# Patient Record
Sex: Female | Born: 1975
Health system: Southern US, Community
[De-identification: ages and names within clinical notes are randomized; demographics above are authoritative.]

## PROBLEM LIST (undated history)

## (undated) ENCOUNTER — Emergency Department (HOSPITAL_COMMUNITY): Discharge status: Absent without leave | Payer: Self-pay | Source: Home / Self Care

## (undated) DIAGNOSIS — IMO0001 Reserved for inherently not codable concepts without codable children: Secondary | ICD-10-CM

## (undated) DIAGNOSIS — J45909 Unspecified asthma, uncomplicated: Secondary | ICD-10-CM

## (undated) DIAGNOSIS — F419 Anxiety disorder, unspecified: Secondary | ICD-10-CM

## (undated) DIAGNOSIS — Z9889 Other specified postprocedural states: Secondary | ICD-10-CM

## (undated) DIAGNOSIS — B001 Herpesviral vesicular dermatitis: Secondary | ICD-10-CM

## (undated) HISTORY — DX: Unspecified asthma, uncomplicated: J45.909

## (undated) HISTORY — PX: INTRAUTERINE DEVICE INSERTION: SHX323

## (undated) HISTORY — DX: Herpesviral vesicular dermatitis: B00.1

## (undated) HISTORY — DX: Reserved for inherently not codable concepts without codable children: IMO0001

## (undated) HISTORY — DX: Other specified postprocedural states: Z98.890

## (undated) HISTORY — DX: Anxiety disorder, unspecified: F41.9

---

## 1994-02-23 HISTORY — PX: BREAST EXCISIONAL BIOPSY: SUR124

## 1994-02-23 HISTORY — PX: OTHER SURGICAL HISTORY: SHX169

## 1999-12-16 ENCOUNTER — Encounter: Admission: RE | Admit: 1999-12-16 | Discharge: 1999-12-16 | Payer: Self-pay | Admitting: Hematology and Oncology

## 2000-02-13 ENCOUNTER — Encounter: Admission: RE | Admit: 2000-02-13 | Discharge: 2000-02-13 | Payer: Self-pay | Admitting: Internal Medicine

## 2000-04-20 ENCOUNTER — Encounter: Admission: RE | Admit: 2000-04-20 | Discharge: 2000-04-20 | Payer: Self-pay | Admitting: General Surgery

## 2000-04-20 ENCOUNTER — Encounter (HOSPITAL_BASED_OUTPATIENT_CLINIC_OR_DEPARTMENT_OTHER): Payer: Self-pay | Admitting: General Surgery

## 2000-05-10 ENCOUNTER — Ambulatory Visit (HOSPITAL_COMMUNITY): Admission: RE | Admit: 2000-05-10 | Discharge: 2000-05-10 | Payer: Self-pay | Admitting: *Deleted

## 2000-07-15 ENCOUNTER — Inpatient Hospital Stay (HOSPITAL_COMMUNITY): Admission: AD | Admit: 2000-07-15 | Discharge: 2000-07-15 | Payer: Self-pay | Admitting: *Deleted

## 2000-10-05 ENCOUNTER — Inpatient Hospital Stay (HOSPITAL_COMMUNITY): Admission: AD | Admit: 2000-10-05 | Discharge: 2000-10-08 | Payer: Self-pay | Admitting: *Deleted

## 2000-10-13 ENCOUNTER — Encounter: Admission: RE | Admit: 2000-10-13 | Discharge: 2000-11-12 | Payer: Self-pay | Admitting: *Deleted

## 2002-01-06 ENCOUNTER — Other Ambulatory Visit: Admission: RE | Admit: 2002-01-06 | Discharge: 2002-01-06 | Payer: Self-pay | Admitting: Obstetrics and Gynecology

## 2002-08-04 ENCOUNTER — Encounter: Payer: Self-pay | Admitting: Family Medicine

## 2002-08-04 ENCOUNTER — Ambulatory Visit (HOSPITAL_COMMUNITY): Admission: RE | Admit: 2002-08-04 | Discharge: 2002-08-04 | Payer: Self-pay | Admitting: Family Medicine

## 2002-10-27 ENCOUNTER — Emergency Department (HOSPITAL_COMMUNITY): Admission: EM | Admit: 2002-10-27 | Discharge: 2002-10-27 | Payer: Self-pay | Admitting: *Deleted

## 2003-01-17 ENCOUNTER — Other Ambulatory Visit: Admission: RE | Admit: 2003-01-17 | Discharge: 2003-01-17 | Payer: Self-pay | Admitting: Obstetrics and Gynecology

## 2004-04-04 ENCOUNTER — Emergency Department (HOSPITAL_COMMUNITY): Admission: EM | Admit: 2004-04-04 | Discharge: 2004-04-04 | Payer: Self-pay | Admitting: Emergency Medicine

## 2004-04-05 ENCOUNTER — Emergency Department (HOSPITAL_COMMUNITY): Admission: EM | Admit: 2004-04-05 | Discharge: 2004-04-05 | Payer: Self-pay | Admitting: Emergency Medicine

## 2004-04-07 ENCOUNTER — Ambulatory Visit: Payer: Self-pay | Admitting: Pulmonary Disease

## 2004-04-22 ENCOUNTER — Ambulatory Visit: Payer: Self-pay | Admitting: Pulmonary Disease

## 2004-04-28 ENCOUNTER — Ambulatory Visit: Payer: Self-pay | Admitting: Pulmonary Disease

## 2004-09-03 ENCOUNTER — Other Ambulatory Visit: Admission: RE | Admit: 2004-09-03 | Discharge: 2004-09-03 | Payer: Self-pay | Admitting: Obstetrics and Gynecology

## 2005-02-23 LAB — HM HIV SCREENING LAB: HM HIV Screening: NEGATIVE

## 2005-02-23 LAB — HM HEPATITIS C SCREENING LAB: HM Hepatitis Screen: NEGATIVE

## 2005-04-08 ENCOUNTER — Inpatient Hospital Stay (HOSPITAL_COMMUNITY): Admission: AD | Admit: 2005-04-08 | Discharge: 2005-04-08 | Payer: Self-pay | Admitting: Obstetrics and Gynecology

## 2005-06-01 ENCOUNTER — Inpatient Hospital Stay (HOSPITAL_COMMUNITY): Admission: AD | Admit: 2005-06-01 | Discharge: 2005-06-03 | Payer: Self-pay | Admitting: Gynecology

## 2005-07-05 ENCOUNTER — Inpatient Hospital Stay (HOSPITAL_COMMUNITY): Admission: AD | Admit: 2005-07-05 | Discharge: 2005-07-07 | Payer: Self-pay | Admitting: Gynecology

## 2005-08-17 ENCOUNTER — Other Ambulatory Visit: Admission: RE | Admit: 2005-08-17 | Discharge: 2005-08-17 | Payer: Self-pay | Admitting: Gynecology

## 2005-08-23 DIAGNOSIS — IMO0001 Reserved for inherently not codable concepts without codable children: Secondary | ICD-10-CM

## 2005-08-23 HISTORY — DX: Reserved for inherently not codable concepts without codable children: IMO0001

## 2005-09-08 ENCOUNTER — Ambulatory Visit (HOSPITAL_COMMUNITY): Admission: RE | Admit: 2005-09-08 | Discharge: 2005-09-08 | Payer: Self-pay | Admitting: Infectious Diseases

## 2008-02-06 ENCOUNTER — Other Ambulatory Visit: Admission: RE | Admit: 2008-02-06 | Discharge: 2008-02-06 | Payer: Self-pay | Admitting: Family Medicine

## 2009-09-09 ENCOUNTER — Other Ambulatory Visit: Admission: RE | Admit: 2009-09-09 | Discharge: 2009-09-09 | Payer: Self-pay | Admitting: *Deleted

## 2009-09-09 ENCOUNTER — Encounter: Admission: RE | Admit: 2009-09-09 | Discharge: 2009-09-09 | Payer: Self-pay | Admitting: *Deleted

## 2009-09-16 ENCOUNTER — Ambulatory Visit (HOSPITAL_COMMUNITY): Admission: RE | Admit: 2009-09-16 | Discharge: 2009-09-16 | Payer: Self-pay | Admitting: *Deleted

## 2010-03-16 ENCOUNTER — Encounter: Payer: Self-pay | Admitting: *Deleted

## 2010-07-11 NOTE — Discharge Summary (Signed)
[name], [name]            ACCOUNT NO.:  [id number]   MEDICAL RECORD NO.:  [id number]          PATIENT TYPE:  INP   LOCATION:  9158                          FACILITY:  [facility]   PHYSICIAN:  Ivor Costa. Farrel Gobble, M.D. DATE OF BIRTH:  1975-04-28   DATE OF ADMISSION:  06/02/2005  DATE OF DISCHARGE:  06/03/2005                                 DISCHARGE SUMMARY   PRINCIPAL DIAGNOSIS:  Pyelonephritis at 36 weeks.   PRINCIPAL PROCEDURE:  IV antibiotics and a renal ultrasound.   HOSPITAL COURSE:  The patient was admitted on the evening of June 01, 2005  with complaints of low back pain and shaking chills.  The patient reportedly  had a temperature of 102 axillary; however, it defervesced rather quickly  with antipyretics.  She was started on IV antibiotics.  Of note, the patient  had recently been diagnosed with a urinary tract infection and had been  started on Macrobid.  The patient had received IV antibiotics until the  morning of June 03, 2005.  She remained afebrile since her admission.  She  had nonstress tests which were all reactive and assuring.  Her renal  ultrasound showed her kidneys to be essentially unremarkable with mild right  hydronephrosis, most likely related to her gravid uterus, but no evidence of  stones.  Her discharge condition was stable.   LABORATORY DATA:  Her white count was 12.5 with a hemoglobin of 10.4,  hematocrit of 30.3 and platelets of 309.  Her urinalysis showed small  hemoglobin and small leukocyte esterase.  The renal culture is still pending  at the time of discharge.   DISCHARGE MEDICATIONS:  The patient was discharged home with a prescription  for Augmentin 500 b.i.d., #14.   FOLLOW UP:  She was instructed to follow up in our office next week.      Ivor Costa. Farrel Gobble, M.D.  Electronically Signed     THL/MEDQ  D:  06/03/2005  T:  06/03/2005  Job:  [id number]

## 2010-07-11 NOTE — H&P (Signed)
NAMEMARLA, [name]            ACCOUNT NO.:  [id number]   MEDICAL RECORD NO.:  [id number]          PATIENT TYPE:  INP   LOCATION:  9158                          [facility]ACILITY:  [facility]   PHYSICIAN:  Timothy P. Fontaine, M.D.DATE OF BIRTH:  [birth year]   DATE OF ADMISSION:  06/01/2005  DATE OF DISCHARGE:                                HISTORY & PHYSICAL   CHIEF COMPLAINT:  Pregnancy at 37 weeks, right-sided low back pain, shaking  chills.   HISTORY OF PRESENT ILLNESS:  The patient is a 35 year old G2, P70 female at  [redacted] weeks gestation.  Uncomplicated pregnancy with the exception of one  episode of preterm contractions terminated with terbutaline.  No subsequent  therapy required.  She presents with a two-day history of right-sided low  back pain, frequency, and urgency.  She apparently had a urinalysis checked  at work which was suspicious for UTI.  She was begun on Macrodantin the day  of admission.  She subsequently developed shaking chills, increase in the  right low back pain, and presented for evaluation.  For the remainder of her  history, see her [facility].   PHYSICAL EXAMINATION:  VITAL SIGNS:  Axillary temperature 102.  Vital signs  are stable.  HEENT:  Normal.  LUNGS:  Clear.  CARDIAC:  Regular rate.  No rubs, murmurs, or gallops.  ABDOMEN:  Gravid, consistent with dates.  External monitor with reactive  fetal tracing, without regular contractions.  No decelerations.  BACK:  Spine is straight with mild right CVA tenderness.  EXTREMITIES:  Without evidence of DVT.   LABORATORY DATA:  Urinalysis shows 11-20 WBCs, 0-2 RBCs, rare epithelial  cells, small leukocyte esterase, small hemoglobin.  CBC:  White count 12.5,  hemoglobin 10.4, platelet count 309,000.   ASSESSMENT AND PLAN:  The patient is a 35 year old gravida 2, para 1 female  at [redacted] weeks gestation with early pyelonephritis.  She was begun on  intravenous antibiotics and intravenous hydration.  Will check maternal  renal  ultrasound and rule out hydronephrosis or gross evidence of renal  lithiasis.  The patient historically does not have a history of recurrent  urinary tract infections or renal lithiasis and otherwise has had an  uncomplicated pregnancy.  She does have a history of asthma for which she  uses occasional p.r.n. inhaler and has not had an issue with this recently.  The patient has received one dose of Unasyn, 3 gram bolus, with a subsequent  1.5 grams every six hours.  Her temperature curve has responded.  She  currently is afebrile with a temperature of 99.  Will continue on  intravenous antibiotics over the next 24 hours.  Assuming she does well,  ultrasound normal, will tentatively plan on discharge 24 hours after  admission to continue a home dose of oral antibiotics.      Timothy P. Fontaine, M.D.  Electronically Signed     TPF/MEDQ  D:  06/02/2005  T:  06/02/2005  Job:  [id number]

## 2010-07-11 NOTE — H&P (Signed)
[name], HORACE            ACCOUNT NO.:  [id number]   MEDICAL RECORD NO.:  [id number]           PATIENT TYPE:   LOCATION:                                 FACILITY:   PHYSICIAN:  Ivor Costa. Farrel Gobble, M.D.      DATE OF BIRTH:   DATE OF ADMISSION:  DATE OF DISCHARGE:                                HISTORY & PHYSICAL   CHIEF COMPLAINT:  Post dates pregnancy.   HISTORY OF PRESENT ILLNESS:  The patient is a 35 year old G2, P71 with  estimated date of confinement of Jul 01, 2005, estimated gestational age of  40-1/2 weeks, who has been seen in the office with multiple complaints of  prodromal labor.  The patient states that she has been contracting for  approximately one week, rather uncomfortable; however, the contractions  either remain far apart or stall out completely.  She never seems to get  into an active pattern.  She is not sleeping well and is rather  uncomfortable.  Her pregnancy was complicated by pyelonephritis.  The  patient also reports some mid back pain.  A urinalysis was nonspecific and a  culture was pending at the time of this dictation.  She was prophylactically  treated, however, with Macrobid b.i.d. #14.  Her pregnancy is also  complicated by a history of positive group B Strep.  She is A positive,  antibody negative, RPR nonreactive, rubella immune, hepatitis B surface  antigen nonreactive, HIV nonreactive, GBS positive.  Refer to the  [facility].   PHYSICAL EXAMINATION:  GENERAL APPEARANCE:  She is an uncomfortable female  in no acute distress.  LUNGS:  Clear to auscultation.  CARDIOVASCULAR:  Regular rate.  ABDOMEN:  38, heart tones auscultated.  VAGINAL:  Fingertip to 1, 50%, -2 but posterior.  EXTREMITIES:  Negative.   NST was reactive without contractions.   ASSESSMENT:  Prodromal labor without cervical change.  The patient is now  post dates.  We will go ahead and induce her with Cervidil on the evening of  Jul 05, 2005, followed by high dose  Pitocin in the morning of Jul 06, 2005.  The patient was in agreeable to the plan.      Ivor Costa. Farrel Gobble, M.D.  Electronically Signed     THL/MEDQ  D:  07/01/2005  T:  07/01/2005  Job:  [id number]

## 2010-07-17 ENCOUNTER — Ambulatory Visit: Payer: Self-pay | Admitting: Gynecology

## 2010-11-03 ENCOUNTER — Telehealth: Payer: Self-pay | Admitting: *Deleted

## 2010-11-03 ENCOUNTER — Other Ambulatory Visit: Payer: Self-pay | Admitting: *Deleted

## 2010-11-03 DIAGNOSIS — Z3049 Encounter for surveillance of other contraceptives: Secondary | ICD-10-CM

## 2010-11-03 MED ORDER — LEVONORGESTREL 20 MCG/24HR IU IUD: INTRAUTERINE_SYSTEM | Freq: Once | INTRAUTERINE | Status: AC

## 2010-11-03 NOTE — Telephone Encounter (Signed)
Patient informed Mirena cost would be $30 copay. Will call back after AEX to schedule.

## 2010-11-20 ENCOUNTER — Encounter: Payer: Self-pay | Admitting: Gynecology

## 2010-11-20 ENCOUNTER — Ambulatory Visit (INDEPENDENT_AMBULATORY_CARE_PROVIDER_SITE_OTHER): Payer: BC Managed Care – PPO | Admitting: Gynecology

## 2010-11-20 ENCOUNTER — Other Ambulatory Visit (HOSPITAL_COMMUNITY)
Admission: RE | Admit: 2010-11-20 | Discharge: 2010-11-20 | Disposition: A | Discharge status: Home / Self Care | Payer: BC Managed Care – PPO | Source: Ambulatory Visit | Attending: Gynecology | Admitting: Gynecology

## 2010-11-20 VITALS — BP 100/60 | Ht 67.5 in | Wt 159.0 lb

## 2010-11-20 DIAGNOSIS — Z01419 Encounter for gynecological examination (general) (routine) without abnormal findings: Secondary | ICD-10-CM

## 2010-11-20 DIAGNOSIS — E559 Vitamin D deficiency, unspecified: Secondary | ICD-10-CM

## 2010-11-20 DIAGNOSIS — Z8349 Family history of other endocrine, nutritional and metabolic diseases: Secondary | ICD-10-CM

## 2010-11-20 DIAGNOSIS — Z131 Encounter for screening for diabetes mellitus: Secondary | ICD-10-CM

## 2010-11-20 DIAGNOSIS — Z13220 Encounter for screening for lipoid disorders: Secondary | ICD-10-CM

## 2010-11-20 DIAGNOSIS — Z30431 Encounter for routine checking of intrauterine contraceptive device: Secondary | ICD-10-CM

## 2010-11-20 NOTE — Progress Notes (Signed)
[name] January 08, 1976 [id number]        35 y.o.  for annual exam.  Doing well. Has Mirena IUD at 5 years now and is due to be replaced.  Past medical history,surgical history, medications, allergies, family history and social history were all reviewed and documented in the EPIC chart. ROS:  Was performed and pertinent positives and negatives are included in the history.  Exam: chaperone present Filed Vitals:   11/20/10 1428  BP: 100/60   General appearance  Normal Skin grossly normal Head/Neck normal with no cervical or supraclavicular adenopathy thyroid normal Lungs  clear Cardiac RR, without RMG Abdominal  soft, nontender, without masses, organomegaly or hernia Breasts  examined lying and sitting without masses, retractions, discharge or axillary adenopathy. Pelvic  Ext/BUS/vagina  normal   Cervix  normal  Pap done IUD string visualized  Uterus  anteverted, normal size, shape and contour, midline and mobile nontender   Adnexa  Without masses or tenderness    Anus and perineum  normal   Rectovaginal  normal sphincter tone without palpated masses or tenderness.    Assessment/Plan:  35 y.o. female for annual exam.   Doing well. Due to have her Mirena replaced now she to make an appointment to come in to have it removed and replaced. Self breast exams on a monthly basis discussed encouraged. Reviewed screening mammogram recommendations 35 to 40. She has no strong family history and prefers to wait closer to 40. She asked about checking a vitamin D as she has a history of hypo-vitamin D and also for a TSH because she has a strong family history of hypothyroidism. She is not having any overt symptoms at this point such as weight changes skin changes hair changes hot flushes sweats. We'll also check a baseline glucose lipid profile CBC urinalysis along with other blood work. Soon she continues well after replacement of her IUDs she'll follow up here sooner as  needed.    Dara Lords MD, 3:05 PM 11/20/2010

## 2010-11-20 NOTE — Patient Instructions (Signed)
Per discussion during visit

## 2010-11-21 LAB — VITAMIN D 25 HYDROXY (VIT D DEFICIENCY, FRACTURES): Vit D, 25-Hydroxy: 40 ng/mL (ref 30–89)

## 2010-12-12 ENCOUNTER — Ambulatory Visit (INDEPENDENT_AMBULATORY_CARE_PROVIDER_SITE_OTHER): Payer: BC Managed Care – PPO | Admitting: Gynecology

## 2010-12-12 ENCOUNTER — Encounter: Payer: Self-pay | Admitting: Gynecology

## 2010-12-12 DIAGNOSIS — Z30431 Encounter for routine checking of intrauterine contraceptive device: Secondary | ICD-10-CM

## 2010-12-12 DIAGNOSIS — Z3049 Encounter for surveillance of other contraceptives: Secondary | ICD-10-CM

## 2010-12-12 NOTE — Progress Notes (Signed)
Patient presents to have her Mirena IUD replaced. She has read through the booklet and signed the consent form. I reviewed the removal and reinsertion process and the risks to include infection, perforation requiring surgery to remove and the risk of failure.  Exam  external BUS vagina normal, cervix normal IUD string visualized, uterus retroverted normal size midline mobile nontender, adnexa without masses or tenderness  Cervix was visualized IUD string grasped with a  [place] forcep and removed, shown to the patient and discarded. Subsequently the cervix was cleansed with Betadine, single tooth tenaculum anterior lip stabilization, uterus was sounded and a new Mirena IUD was placed according to manufacturer's recommendation. The strings were trimmed and the instruments removed, patient tolerated well. Patient will follow up for a post insertional check in one to 2 months.

## 2011-01-09 ENCOUNTER — Ambulatory Visit (INDEPENDENT_AMBULATORY_CARE_PROVIDER_SITE_OTHER): Payer: BC Managed Care – PPO | Admitting: Gynecology

## 2011-01-09 ENCOUNTER — Encounter: Payer: Self-pay | Admitting: Gynecology

## 2011-01-09 VITALS — BP 110/60

## 2011-01-09 DIAGNOSIS — Z30431 Encounter for routine checking of intrauterine contraceptive device: Secondary | ICD-10-CM

## 2011-01-09 NOTE — Progress Notes (Signed)
Patient presents for follow up IUD check. She had her IUD placed approximately a month ago. She notes that today a little bit of spotting and she has some premenstrual type symptoms of cramping and bloating.  Her husband had noticed the IUD once during intercourse but otherwise has done well.  Exam External BUS vagina normal. Cervix normal IUD string visualized and appropriate length. Bimanual uterus normal size midline mobile nontender adnexa without masses or tenderness  Assessment and plan:  Normal IUD check. Assuming she continues well and she will see me when she is due for her annual sooner as needed.

## 2011-09-14 ENCOUNTER — Other Ambulatory Visit: Payer: Self-pay | Admitting: Family Medicine

## 2011-09-14 DIAGNOSIS — N644 Mastodynia: Secondary | ICD-10-CM

## 2011-09-23 ENCOUNTER — Ambulatory Visit
Admission: RE | Admit: 2011-09-23 | Discharge: 2011-09-23 | Disposition: A | Discharge status: Home / Self Care | Payer: BC Managed Care – PPO | Source: Ambulatory Visit | Attending: Family Medicine | Admitting: Family Medicine

## 2011-09-23 ENCOUNTER — Other Ambulatory Visit: Payer: Self-pay | Admitting: Family Medicine

## 2011-09-23 DIAGNOSIS — N644 Mastodynia: Secondary | ICD-10-CM

## 2011-10-21 ENCOUNTER — Other Ambulatory Visit: Payer: Self-pay | Admitting: Infectious Diseases

## 2011-10-21 ENCOUNTER — Ambulatory Visit
Admission: RE | Admit: 2011-10-21 | Discharge: 2011-10-21 | Disposition: A | Discharge status: Home / Self Care | Payer: No Typology Code available for payment source | Source: Ambulatory Visit | Attending: Infectious Diseases | Admitting: Infectious Diseases

## 2011-10-21 DIAGNOSIS — R7612 Nonspecific reaction to cell mediated immunity measurement of gamma interferon antigen response without active tuberculosis: Secondary | ICD-10-CM

## 2012-06-03 ENCOUNTER — Other Ambulatory Visit: Payer: Self-pay | Admitting: Family Medicine

## 2012-06-03 DIAGNOSIS — D242 Benign neoplasm of left breast: Secondary | ICD-10-CM

## 2012-06-03 DIAGNOSIS — N6320 Unspecified lump in the left breast, unspecified quadrant: Secondary | ICD-10-CM

## 2012-07-04 ENCOUNTER — Ambulatory Visit
Admission: RE | Admit: 2012-07-04 | Discharge: 2012-07-04 | Disposition: A | Discharge status: Home / Self Care | Payer: Self-pay | Source: Ambulatory Visit | Attending: Family Medicine | Admitting: Family Medicine

## 2012-07-04 DIAGNOSIS — D242 Benign neoplasm of left breast: Secondary | ICD-10-CM

## 2012-07-20 ENCOUNTER — Other Ambulatory Visit: Payer: Self-pay | Admitting: Family Medicine

## 2012-07-20 DIAGNOSIS — N63 Unspecified lump in unspecified breast: Secondary | ICD-10-CM

## 2012-12-30 ENCOUNTER — Ambulatory Visit
Admission: RE | Admit: 2012-12-30 | Discharge: 2012-12-30 | Disposition: A | Discharge status: Home / Self Care | Payer: BC Managed Care – PPO | Source: Ambulatory Visit | Attending: Family Medicine | Admitting: Family Medicine

## 2012-12-30 ENCOUNTER — Other Ambulatory Visit: Payer: Self-pay | Admitting: Family Medicine

## 2012-12-30 DIAGNOSIS — N63 Unspecified lump in unspecified breast: Secondary | ICD-10-CM

## 2013-01-31 ENCOUNTER — Other Ambulatory Visit (HOSPITAL_COMMUNITY)
Admission: RE | Admit: 2013-01-31 | Discharge: 2013-01-31 | Disposition: A | Discharge status: Home / Self Care | Payer: BC Managed Care – PPO | Source: Ambulatory Visit | Attending: Family Medicine | Admitting: Family Medicine

## 2013-01-31 ENCOUNTER — Other Ambulatory Visit: Payer: Self-pay | Admitting: Family Medicine

## 2013-01-31 DIAGNOSIS — Z0000 Encounter for general adult medical examination without abnormal findings: Secondary | ICD-10-CM | POA: Insufficient documentation

## 2013-12-25 ENCOUNTER — Encounter: Payer: Self-pay | Admitting: Gynecology

## 2014-02-01 ENCOUNTER — Other Ambulatory Visit: Payer: Self-pay | Admitting: Family Medicine

## 2014-02-01 ENCOUNTER — Other Ambulatory Visit (HOSPITAL_COMMUNITY)
Admission: RE | Admit: 2014-02-01 | Discharge: 2014-02-01 | Disposition: A | Discharge status: Home / Self Care | Payer: BC Managed Care – PPO | Source: Ambulatory Visit | Attending: Family Medicine | Admitting: Family Medicine

## 2014-02-01 DIAGNOSIS — Z01419 Encounter for gynecological examination (general) (routine) without abnormal findings: Secondary | ICD-10-CM | POA: Diagnosis present

## 2014-02-01 DIAGNOSIS — N644 Mastodynia: Secondary | ICD-10-CM

## 2014-02-02 LAB — CYTOLOGY - PAP

## 2014-02-27 ENCOUNTER — Ambulatory Visit
Admission: RE | Admit: 2014-02-27 | Discharge: 2014-02-27 | Disposition: A | Discharge status: Home / Self Care | Payer: BLUE CROSS/BLUE SHIELD | Source: Ambulatory Visit | Attending: Family Medicine | Admitting: Family Medicine

## 2014-02-27 DIAGNOSIS — N644 Mastodynia: Secondary | ICD-10-CM

## 2015-03-15 ENCOUNTER — Other Ambulatory Visit: Payer: Self-pay

## 2015-03-15 DIAGNOSIS — Z1231 Encounter for screening mammogram for malignant neoplasm of breast: Secondary | ICD-10-CM

## 2015-04-04 ENCOUNTER — Ambulatory Visit
Admission: RE | Admit: 2015-04-04 | Discharge: 2015-04-04 | Disposition: A | Discharge status: Home / Self Care | Payer: BLUE CROSS/BLUE SHIELD | Source: Ambulatory Visit

## 2015-04-04 DIAGNOSIS — Z1231 Encounter for screening mammogram for malignant neoplasm of breast: Secondary | ICD-10-CM

## 2015-04-29 ENCOUNTER — Telehealth: Payer: Self-pay | Admitting: Gynecology

## 2015-04-29 NOTE — Telephone Encounter (Signed)
04/29/15-I checked this pt benefits to replace the existing MIrena for contraception and her [facility] covers this at 100%, no copay. Per [facility] [phone].wl

## 2015-05-27 ENCOUNTER — Encounter: Payer: Self-pay | Admitting: Gynecology

## 2015-05-27 ENCOUNTER — Ambulatory Visit (INDEPENDENT_AMBULATORY_CARE_PROVIDER_SITE_OTHER): Payer: BLUE CROSS/BLUE SHIELD | Admitting: Gynecology

## 2015-05-27 VITALS — BP 110/64 | Ht 68.0 in | Wt 159.0 lb

## 2015-05-27 DIAGNOSIS — Z30431 Encounter for routine checking of intrauterine contraceptive device: Secondary | ICD-10-CM

## 2015-05-27 DIAGNOSIS — Z01419 Encounter for gynecological examination (general) (routine) without abnormal findings: Secondary | ICD-10-CM

## 2015-05-27 NOTE — Patient Instructions (Signed)
Follow up for replacement of your IUD this coming fall.

## 2015-05-27 NOTE — Progress Notes (Signed)
    [name] [birth year] [id number]        39 y.o.  G2P2  for annual exam.  Patient has not been seen since 2012 when she had her IUD placed. Recently saw her primary physician who performed a complete exam accepting no documented breast exam and no pelvic exam. Also wanted [place] to discuss with her when her IUD is due to be replaced. She is doing well from a gynecologic standpoint without menses. Mirena IUD was placed 11/2010.  Pap smear 01/2014.  Past medical history,surgical history, problem list, medications, allergies, family history and social history were all reviewed and documented as reviewed in the EPIC chart.  ROS:  Performed with pertinent positives and negatives included in the history, assessment and plan.   Additional significant findings :  none   Exam: Caryn Bee assistant Filed Vitals:   05/27/15 1157  BP: 110/64  Height: 5\' 8"  (1.727 m)  Weight: 159 lb (72.122 kg)   General appearance:  Normal affect, orientation and appearance. Skin: Grossly normal Abdominal:  Soft, nontender, without masses, guarding, rebound, organomegaly or hernia Breasts:  Examined lying and sitting without masses, retractions, discharge or axillary adenopathy. Pelvic:  Ext/BUS/vagina normal  Cervix normal with IUD string visualized  Uterus retroverted, normal size, shape and contour, midline and mobile nontender   Adnexa without masses or tenderness    Anus and perineum normal   Rectovaginal normal sphincter tone without palpated masses or tenderness.    Assessment/Plan:  40 y.o. G2P2 female for annual exam without menses, Mirena IUD.   1. Mirena IUD 11/2010. Doing well without menses. IUD string visualized. Instructed patient to follow up this fall to have it replaced at her choice. 2. Pap smear 01/2014. No Pap smear done today. No history of abnormal Pap smears. Planned repeat Pap smear at 3 year interval per current screening guidelines. 3. Mammography 03/2015. 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 They're not palpable on exam. Her exam today is normal. SBE monthly reviewed. Continue with annual mammography when due. 4. Health maintenance. No routine lab work done as this is done through her primary physician's office. Follow up 1 year, sooner as needed.   Anastasio Auerbach MD, 12:21 PM 05/27/2015

## 2015-05-28 ENCOUNTER — Telehealth: Payer: Self-pay | Admitting: Gynecology

## 2015-05-28 NOTE — Telephone Encounter (Signed)
05/28/15-Pt was advised today that her [facility] will cover the new Mirena for contraception with removal of existing one at 100%, no copay. This is a calendar year plan and pt has appt for October 2017 for this.  Per Ashley@[facility] [phone].wl

## 2015-12-11 ENCOUNTER — Ambulatory Visit (INDEPENDENT_AMBULATORY_CARE_PROVIDER_SITE_OTHER): Payer: BLUE CROSS/BLUE SHIELD | Admitting: Gynecology

## 2015-12-11 ENCOUNTER — Encounter: Payer: Self-pay | Admitting: Gynecology

## 2015-12-11 VITALS — BP 112/70

## 2015-12-11 DIAGNOSIS — M5432 Sciatica, left side: Secondary | ICD-10-CM

## 2015-12-11 DIAGNOSIS — Z30433 Encounter for removal and reinsertion of intrauterine contraceptive device: Secondary | ICD-10-CM

## 2015-12-11 DIAGNOSIS — M5431 Sciatica, right side: Secondary | ICD-10-CM

## 2015-12-11 MED ORDER — PREDNISONE 20 MG PO TABS: 20.0000 mg | ORAL_TABLET | Freq: Three times a day (TID) | ORAL | 0 refills | Status: DC

## 2015-12-11 NOTE — Patient Instructions (Signed)
Intrauterine Device Insertion Most often, an intrauterine device (IUD) is inserted into the uterus to prevent pregnancy. There are 2 types of IUDs available:  Copper IUD--This type of IUD creates an environment that is not favorable to sperm survival. The mechanism of action of the copper IUD is not known for certain. It can stay in place for 10 years.  Hormone IUD--This type of IUD contains the hormone progestin (synthetic progesterone). The progestin thickens the cervical mucus and prevents sperm from entering the uterus, and it also thins the uterine lining. There is no evidence that the hormone IUD prevents implantation. One hormone IUD can stay in place for up to 5 years, and a different hormone IUD can stay in place for up to 3 years. An IUD is the most cost-effective birth control if left in place for the full duration. It may be removed at any time. LET YOUR HEALTH CARE PROVIDER KNOW ABOUT:  Any allergies you have.  All medicines you are taking, including vitamins, herbs, eye drops, creams, and over-the-counter medicines.  Previous problems you or members of your family have had with the use of anesthetics.  Any blood disorders you have.  Previous surgeries you have had.  Possibility of pregnancy.  Medical conditions you have. RISKS AND COMPLICATIONS  Generally, intrauterine device insertion is a safe procedure. However, as with any procedure, complications can occur. Possible complications include:  Accidental puncture (perforation) of the uterus.  Accidental placement of the IUD either in the muscle layer of the uterus (myometrium) or outside the uterus. If this happens, the IUD can be found essentially floating around the bowels and must be taken out surgically.  The IUD may fall out of the uterus (expulsion). This is more common in women who have recently had a child.   Pregnancy in the fallopian tube (ectopic).  Pelvic inflammatory disease (PID), which is infection of  the uterus and fallopian tubes. The risk of PID is slightly increased in the first 20 days after the IUD is placed, but the overall risk is still very low. BEFORE THE PROCEDURE  Schedule the IUD insertion for when you will have your menstrual period or right after, to make sure you are not pregnant. Placement of the IUD is better tolerated shortly after a menstrual cycle.  You may need to take tests or be examined to make sure you are not pregnant.  You may be required to take a pregnancy test.  You may be required to get checked for sexually transmitted infections (STIs) prior to placement. Placing an IUD in someone who has an infection can make the infection worse.  You may be given a pain reliever to take 1 or 2 hours before the procedure.  An exam will be performed to determine the size and position of your uterus.  Ask your health care provider about changing or stopping your regular medicines. PROCEDURE   A tool (speculum) is placed in the vagina. This allows your health care provider to see the lower part of the uterus (cervix).  The cervix is prepped with a medicine that lowers the risk of infection.  You may be given a medicine to numb each side of the cervix (intracervical or paracervical block). This is used to block and control any discomfort with insertion.  A tool (uterine sound) is inserted into the uterus to determine the length of the uterine cavity and the direction the uterus may be tilted.  A slim instrument (IUD inserter) is inserted through the cervical   canal and into your uterus.  The IUD is placed in the uterine cavity and the insertion device is removed.  The nylon string that is attached to the IUD and used for eventual IUD removal is trimmed. It is trimmed so that it lays high in the vagina, just outside the cervix. AFTER THE PROCEDURE  You may have bleeding after the procedure. This is normal. It varies from light spotting for a few days to menstrual-like  bleeding.  You may have mild cramping.   This information is not intended to replace advice given to you by your health care provider. Make sure you discuss any questions you have with your health care provider.   Document Released: 10/08/2010 Document Revised: 11/30/2012 Document Reviewed: 07/31/2012 Elsevier Interactive Patient Education 2016 Elsevier Inc.  

## 2015-12-11 NOTE — Progress Notes (Signed)
    [name] [birth year] [id number]        40 y.o.  G2P2  presents for Mirena IUD removal and replacement. She has read through the booklet, has no contraindications and signed the consent form. She currently is not having menses.  I reviewed the removal and insertional process with her as well as the risks to include infection, either immediate or long-term, uterine perforation or migration requiring surgery to remove, other complications such as pain and possibility of failure with subsequent pregnancy. Patient also is experiencing sciatica bilateral leg tingling and significant low back pain. She has a history of this in the past and is treated with nonsteroidal anti-inflammatories which she has at home and usually a steroid tapered course. She asked if I could prescribe prednisone for her to save her from going to her primary physician's office.  Exam with Caryn Bee assistant Vitals:   12/11/15 1058  BP: 112/70    Pelvic: External BUS vagina normal. Cervix normal With IUD string visualized. Uterus retroverted normal size shape contour midline mobile nontender. Adnexa without masses or tenderness.  Procedure: The cervix was visualized with a speculum, the IUD string grasped with the [facility] forcep and the old Mirena IUD was removed, shown to the patient and discarded. The cervix was then cleansed with Betadine, anterior lip grasped with a single-tooth tenaculum, the uterus was sounded and a Mirena IUD was placed according to manufacturer's recommendations without difficulty. The strings were trimmed. The patient tolerated well and will follow up in one month for a postinsertional check.  Prednisone 20 mg 3 times a day 1 week, twice a day 2 days and daily 2 days taper prescribed. If symptoms persist or certainly worsen she will follow up with her primary physician.  Lot number:  [id number]    Anastasio Auerbach MD, 11:21 AM 12/11/2015

## 2017-02-03 ENCOUNTER — Other Ambulatory Visit: Payer: Self-pay | Admitting: Family Medicine

## 2017-02-03 ENCOUNTER — Other Ambulatory Visit (HOSPITAL_COMMUNITY)
Admission: RE | Admit: 2017-02-03 | Discharge: 2017-02-03 | Disposition: A | Discharge status: Home / Self Care | Payer: BLUE CROSS/BLUE SHIELD | Source: Ambulatory Visit | Attending: Family Medicine | Admitting: Family Medicine

## 2017-02-03 DIAGNOSIS — Z0000 Encounter for general adult medical examination without abnormal findings: Secondary | ICD-10-CM | POA: Diagnosis not present

## 2017-02-05 LAB — CYTOLOGY - PAP
Diagnosis: NEGATIVE
HPV: NOT DETECTED

## 2017-03-01 ENCOUNTER — Other Ambulatory Visit: Payer: Self-pay | Admitting: Family Medicine

## 2017-03-01 DIAGNOSIS — Z1231 Encounter for screening mammogram for malignant neoplasm of breast: Secondary | ICD-10-CM

## 2017-03-22 ENCOUNTER — Ambulatory Visit
Admission: RE | Admit: 2017-03-22 | Discharge: 2017-03-22 | Disposition: A | Discharge status: Home / Self Care | Payer: BLUE CROSS/BLUE SHIELD | Source: Ambulatory Visit | Attending: Family Medicine | Admitting: Family Medicine

## 2017-03-22 DIAGNOSIS — Z1231 Encounter for screening mammogram for malignant neoplasm of breast: Secondary | ICD-10-CM

## 2018-02-07 ENCOUNTER — Other Ambulatory Visit: Payer: Self-pay | Admitting: Family Medicine

## 2018-02-07 DIAGNOSIS — Z1231 Encounter for screening mammogram for malignant neoplasm of breast: Secondary | ICD-10-CM

## 2018-03-23 ENCOUNTER — Ambulatory Visit
Admission: RE | Admit: 2018-03-23 | Discharge: 2018-03-23 | Disposition: A | Discharge status: Home / Self Care | Payer: BLUE CROSS/BLUE SHIELD | Source: Ambulatory Visit | Attending: Family Medicine | Admitting: Family Medicine

## 2018-03-23 DIAGNOSIS — Z1231 Encounter for screening mammogram for malignant neoplasm of breast: Secondary | ICD-10-CM

## 2018-09-30 ENCOUNTER — Ambulatory Visit (INDEPENDENT_AMBULATORY_CARE_PROVIDER_SITE_OTHER): Payer: BC Managed Care – PPO | Admitting: Family Medicine

## 2018-09-30 ENCOUNTER — Other Ambulatory Visit: Payer: Self-pay

## 2018-09-30 ENCOUNTER — Encounter: Payer: Self-pay | Admitting: Family Medicine

## 2018-09-30 ENCOUNTER — Telehealth: Payer: Self-pay | Admitting: Family Medicine

## 2018-09-30 VITALS — BP 102/70 | HR 90 | Temp 98.8°F | Ht 67.0 in | Wt 171.6 lb

## 2018-09-30 DIAGNOSIS — M792 Neuralgia and neuritis, unspecified: Secondary | ICD-10-CM

## 2018-09-30 DIAGNOSIS — J4520 Mild intermittent asthma, uncomplicated: Secondary | ICD-10-CM

## 2018-09-30 DIAGNOSIS — J45909 Unspecified asthma, uncomplicated: Secondary | ICD-10-CM | POA: Insufficient documentation

## 2018-09-30 DIAGNOSIS — F40240 Claustrophobia: Secondary | ICD-10-CM

## 2018-09-30 DIAGNOSIS — M25512 Pain in left shoulder: Secondary | ICD-10-CM | POA: Diagnosis not present

## 2018-09-30 DIAGNOSIS — K5909 Other constipation: Secondary | ICD-10-CM | POA: Insufficient documentation

## 2018-09-30 DIAGNOSIS — G8929 Other chronic pain: Secondary | ICD-10-CM

## 2018-09-30 MED ORDER — DICLOFENAC SODIUM 75 MG PO TBEC: 75.0000 mg | DELAYED_RELEASE_TABLET | Freq: Two times a day (BID) | ORAL | 0 refills | Status: DC

## 2018-09-30 MED ORDER — DICLOFENAC SODIUM 1 % TD GEL: 2.0000 g | Freq: Four times a day (QID) | TRANSDERMAL | 0 refills | Status: DC

## 2018-09-30 NOTE — Telephone Encounter (Signed)
P.A. DICLOFENAC

## 2018-09-30 NOTE — Progress Notes (Signed)
Subjective:    Patient ID: [name], female    DOB: 1975-03-03, 43 y.o.   MRN: [id number]  HPI Chief Complaint  Patient presents with  . Establish Care  . Shoulder Pain    left x2 months    She is new to the practice and here to establish care.  Previous medical care: multiple PCPs in the past 2 years.  She is former patient of Dr. Tomi Bamberger when she was at [facility] years ago.   Complains of left shoulder pain for the past 2 months. Pain is worsening. Located posterior and anterior shoulder but radicular pain is her lateral left arm. Often feels like an electrical current or sharp down her left arm into her 5th finger.  History of injury to neck many years ago.   States pain at night waking her up is also worsening.   She saw seen a chiropractor 2 days ago and had XRs. Dr. Marlou Sa Meylor  Has appt next week to get results.    Chronic constipation. Started a probiotic last week.   Other providers: OB/GYN- Dr. Phineas Real    Social history: Lives with her husband, has 2 sons who are teenagers. works as Marine scientist in the ED  Denies smoking, drinking alcohol, drug use  Diet: healthy  Excerise: none  Has IUD  Reviewed allergies, medications, past medical, surgical, family, and social history.    Review of Systems Pertinent positives and negatives in the history of present illness.     Objective:   Physical Exam Constitutional:      General: She is not in acute distress.    Appearance: Normal appearance. She is not ill-appearing.  Neck:     Musculoskeletal: Normal range of motion and neck supple. No muscular tenderness.  Musculoskeletal:     Left shoulder: She exhibits decreased range of motion, tenderness, pain and decreased strength. She exhibits normal pulse.     Cervical back: Normal.     Comments: Anterior and posterior pain with limited ROM. Slight weakness with flexion against resistance compared to right. Negative drop arm test. Negative Neers and Hawkins.   Skin:   General: Skin is warm and dry.     Capillary Refill: Capillary refill takes less than 2 seconds.  Neurological:     Mental Status: She is alert and oriented to person, place, and time.     Cranial Nerves: Cranial nerves are intact.     Sensory: Sensation is intact.    BP 102/70   Pulse 90   Temp 98.8 F (37.1 C) (Oral)   Ht 5\' 7"  (1.702 m)   Wt 171 lb 9.6 oz (77.8 kg)   SpO2 96%   BMI 26.88 kg/m        Assessment & Plan:  Chronic pain in left shoulder - Plan: diclofenac (VOLTAREN) 75 MG EC tablet, diclofenac sodium (VOLTAREN) 1 % GEL, no laxity or sign of infection in joint. She does have limited ROM which made the exam somewhat limited. She saw a chiropractor 2 days ago and will get XR results on Monday. We will await these results. Discussed that I am ok if she decides to pursue treatment with the chiropractor or she can let me know and we can refer her to ortho or PT. She may need an MRI but will await XR results. Stop OTC NSAIDs and try the oral and topical diclofenac.   Mild intermittent asthma without complication - Plan: controlled. Only has issus mainly with allergens such as bleach  Radicular pain in left arm - Plan: diclofenac (VOLTAREN) 75 MG EC tablet, long history of injury to neck with ?DDD in cervical region. Treat conservatively and follow up pending chiropractor appt Monday and XR results.   If she needs an MRI she will need Valium or Ativan due to claustrophobia

## 2018-10-01 NOTE — Telephone Encounter (Signed)
PA approved.

## 2018-10-04 ENCOUNTER — Encounter: Payer: Self-pay | Admitting: Family Medicine

## 2018-10-04 ENCOUNTER — Other Ambulatory Visit: Payer: Self-pay | Admitting: Family Medicine

## 2018-10-04 DIAGNOSIS — M792 Neuralgia and neuritis, unspecified: Secondary | ICD-10-CM

## 2018-10-04 DIAGNOSIS — G8929 Other chronic pain: Secondary | ICD-10-CM

## 2018-11-15 ENCOUNTER — Encounter: Payer: Self-pay | Admitting: Gynecology

## 2018-11-29 ENCOUNTER — Other Ambulatory Visit: Payer: Self-pay | Admitting: Orthopedic Surgery

## 2018-11-29 DIAGNOSIS — G8929 Other chronic pain: Secondary | ICD-10-CM

## 2018-12-21 ENCOUNTER — Ambulatory Visit
Admission: RE | Admit: 2018-12-21 | Discharge: 2018-12-21 | Disposition: A | Discharge status: Home / Self Care | Payer: BC Managed Care – PPO | Source: Ambulatory Visit | Attending: Orthopedic Surgery | Admitting: Orthopedic Surgery

## 2018-12-21 ENCOUNTER — Other Ambulatory Visit: Payer: Self-pay

## 2018-12-21 DIAGNOSIS — M25512 Pain in left shoulder: Secondary | ICD-10-CM

## 2018-12-21 DIAGNOSIS — G8929 Other chronic pain: Secondary | ICD-10-CM

## 2019-02-08 ENCOUNTER — Other Ambulatory Visit: Payer: Self-pay | Admitting: Family Medicine

## 2019-02-08 DIAGNOSIS — Z1231 Encounter for screening mammogram for malignant neoplasm of breast: Secondary | ICD-10-CM

## 2019-03-27 ENCOUNTER — Other Ambulatory Visit: Payer: Self-pay

## 2019-03-27 ENCOUNTER — Ambulatory Visit
Admission: RE | Admit: 2019-03-27 | Discharge: 2019-03-27 | Disposition: A | Discharge status: Home / Self Care | Payer: BC Managed Care – PPO | Source: Ambulatory Visit | Attending: Family Medicine | Admitting: Family Medicine

## 2019-03-27 DIAGNOSIS — Z1231 Encounter for screening mammogram for malignant neoplasm of breast: Secondary | ICD-10-CM

## 2019-11-08 ENCOUNTER — Encounter: Payer: Self-pay | Admitting: Family Medicine

## 2019-11-08 MED ORDER — VALACYCLOVIR HCL 1 G PO TABS: ORAL_TABLET | ORAL | 1 refills | Status: DC

## 2019-11-08 NOTE — Telephone Encounter (Signed)
Refilled med

## 2019-11-09 MED ORDER — VALACYCLOVIR HCL 1 G PO TABS: ORAL_TABLET | ORAL | 1 refills | Status: DC

## 2019-11-09 MED FILL — valACYclovir HCL 1 GM TABS: 1 | 7 days supply | Qty: 30 | Fill #0

## 2020-02-20 ENCOUNTER — Ambulatory Visit: Payer: BC Managed Care – PPO | Attending: Internal Medicine

## 2020-02-20 ENCOUNTER — Other Ambulatory Visit (HOSPITAL_BASED_OUTPATIENT_CLINIC_OR_DEPARTMENT_OTHER): Payer: Self-pay | Admitting: Internal Medicine

## 2020-02-20 DIAGNOSIS — Z23 Encounter for immunization: Secondary | ICD-10-CM

## 2020-02-20 NOTE — Progress Notes (Signed)
   Covid-19 Vaccination Clinic  Name:  Margarete Horace    MRN: [id number] DOB: 08-20-1975  02/20/2020  Ms. Doble was observed post Covid-19 immunization for 15 minutes without incident. She was provided with Vaccine Information Sheet and instruction to access the V-Safe system.  Vaccinated by Fredirick Maudlin  Ms. Erlich was instructed to call 911 with any severe reactions post vaccine: Marland Kitchen Difficulty breathing  . Swelling of face and throat  . A fast heartbeat  . A bad rash all over body  . Dizziness and weakness   Immunizations Administered    Name Date Dose VIS Date Route   Pfizer COVID-19 Vaccine 02/20/2020  1:44 PM 0.3 mL 12/13/2019 Intramuscular   Manufacturer: [employer], [employer]   Lot: [id number]   NDC: [address]-7

## 2020-02-21 MED FILL — PFIZER-BIONTECH COVID-19 VA: 30 | 21 days supply | Qty: 0 | Fill #0

## 2020-03-04 ENCOUNTER — Other Ambulatory Visit: Payer: BC Managed Care – PPO

## 2020-03-04 DIAGNOSIS — Z20822 Contact with and (suspected) exposure to covid-19: Secondary | ICD-10-CM

## 2020-03-06 LAB — SARS-COV-2, NAA 2 DAY TAT

## 2020-03-06 LAB — NOVEL CORONAVIRUS, NAA: SARS-CoV-2, NAA: NOT DETECTED

## 2020-03-19 ENCOUNTER — Other Ambulatory Visit: Payer: Self-pay | Admitting: Family Medicine

## 2020-03-19 DIAGNOSIS — Z1231 Encounter for screening mammogram for malignant neoplasm of breast: Secondary | ICD-10-CM

## 2020-04-10 ENCOUNTER — Other Ambulatory Visit: Payer: Self-pay

## 2020-04-10 ENCOUNTER — Ambulatory Visit
Admission: RE | Admit: 2020-04-10 | Discharge: 2020-04-10 | Disposition: A | Discharge status: Home / Self Care | Payer: BC Managed Care – PPO | Source: Ambulatory Visit

## 2020-04-10 DIAGNOSIS — Z1231 Encounter for screening mammogram for malignant neoplasm of breast: Secondary | ICD-10-CM

## 2020-11-28 NOTE — Progress Notes (Signed)
Subjective:    Patient ID: [name], female    DOB: 07/09/1975, 45 y.o.   MRN: [id number]  HPI Chief Complaint  Patient presents with   Annual Exam    CPE no other issues, no obgyn past few years, needs breast and pelvic and may be due for PAP been over 2 years, pt. Also needs refill on albuterol inhaler.    She is new to the practice and here for a complete physical exam.  Social history: works as a Marine scientist for [employer]  Denies smoking, drinking alcohol, drug use  Diet: fairly healthy  Excerise: nothing  lately   Immunizations: [place] maintenance:   Mammogram: 04/10/2020 Colonoscopy: never  Last Gynecological Exam: pap smear 01/2017 Last Menstrual cycle: IUD in 2017 Last Dental Exam: years ago Last Eye Exam: UTD   Wears seatbelt always, smoke detectors in home and functioning, does not text while driving and feels safe in home environment.   Reviewed allergies, medications, past medical, surgical, family, and social history.    Review of Systems Review of Systems Constitutional: -fever, -chills, -sweats, -unexpected weight change,-fatigue ENT: -runny nose, -ear pain, -sore throat Cardiology:  -chest pain, -palpitations, -edema Respiratory: -cough, -shortness of breath, -wheezing Gastroenterology: -abdominal pain, -nausea, -vomiting, -diarrhea, -constipation  Hematology: -bleeding or bruising problems Musculoskeletal: -arthralgias, -myalgias, -joint swelling, -back pain Ophthalmology: -vision changes Urology: -dysuria, -difficulty urinating, -hematuria, -urinary frequency, -urgency Neurology: -headache, -weakness, -tingling, -numbness       Objective:   Physical Exam BP 118/78   Pulse 80   Temp 98 F (36.7 C)   Ht 5\' 8"  (1.727 m)   Wt 176 lb 3.2 oz (79.9 kg)   BMI 26.79 kg/m   General Appearance:    Alert, cooperative, no distress, appears stated age  Head:    Normocephalic, without obvious abnormality, atraumatic  Eyes:    PERRL,  conjunctiva/corneas clear, EOM's intact  Ears:    Normal TM's and external ear canals  Nose:   Mask on   Throat:   Mask on   Neck:   Supple, no lymphadenopathy;  thyroid:  no   enlargement/tenderness/nodules  Back:    Spine nontender, no curvature, ROM normal, no CVA     tenderness  Lungs:     Clear to auscultation bilaterally without wheezes, rales or     ronchi; respirations unlabored  Chest Wall:    No tenderness or deformity   Heart:    Regular rate and rhythm, S1 and S2 normal, no murmur, rub   or gallop  Breast Exam:    OB/GYN     No axillary lymphadenopathy  Abdomen:     Soft, non-tender, nondistended, normoactive bowel sounds,    no masses, no hepatosplenomegaly  Genitalia:    OB/GYN     Extremities:   No clubbing, cyanosis or edema  Pulses:   2+ and symmetric all extremities  Skin:   Skin color, texture, turgor normal, no rashes or lesions  Lymph nodes:   Cervical, supraclavicular, and axillary nodes normal  Neurologic:   CNII-XII intact, normal strength, sensation and gait          Psych:   Normal mood, affect, hygiene and grooming.          Assessment & Plan:  Routine general medical examination at a health care facility - Plan: CBC with Differential/Platelet, Comprehensive metabolic panel, TSH, T4, free, T3, Lipid panel  Screening for thyroid disorder - Plan: TSH, T4, free, T3  Screening for colon cancer - Plan: Ambulatory referral to Gastroenterology  Screening for lipid disorders - Plan: Lipid panel  She is here today for a CPE.  Preventive health care reviewed.  She will follow-up with her OB/GYN for Pap smear and IUD removal.  Mammogram up-to-date.  Refer to GI for first screening colonoscopy.  Counseling on healthy lifestyle including diet and exercise.  Recommend regular dental and eye exams.  Immunizations reviewed.  She is up-to-date, [employer] employee. Discussed safety.

## 2020-11-28 NOTE — Patient Instructions (Addendum)
[facility] to schedule.   You will hear from [facility]   Dr. Almyra Free Dentist     Preventive Care 47-47 Years Old, Female Preventive care refers to lifestyle choices and visits with your health care provider that can promote health and wellness. This includes: A yearly physical exam. This is also called an annual wellness visit. Regular dental and eye exams. Immunizations. Screening for certain conditions. Healthy lifestyle choices, such as: Eating a healthy diet. Getting regular exercise. Not using drugs or products that contain nicotine and tobacco. Limiting alcohol use. What can I expect for my preventive care visit? Physical exam Your health care provider will check your: Height and weight. These may be used to calculate your BMI (body mass index). BMI is a measurement that tells if you are at a healthy weight. Heart rate and blood pressure. Body temperature. Skin for abnormal spots. Counseling Your health care provider may ask you questions about your: Past medical problems. Family's medical history. Alcohol, tobacco, and drug use. Emotional well-being. Home life and relationship well-being. Sexual activity. Diet, exercise, and sleep habits. Work and work Statistician. Access to firearms. Method of birth control. Menstrual cycle. Pregnancy history. What immunizations do I need? Vaccines are usually given at various ages, according to a schedule. Your health care provider will recommend vaccines for you based on your age, medical history, and lifestyle or other factors, such as travel or where you work. What tests do I need? Blood tests Lipid and cholesterol levels. These may be checked every 5 years, or more often if you are over 65 years old. Hepatitis C test. Hepatitis B test. Screening Lung cancer screening. You may have this screening every year starting at age 52 if you have a 30-pack-year history of smoking and currently smoke or have quit within the  past 15 years. Colorectal cancer screening. All adults should have this screening starting at age 8 and continuing until age 67. Your health care provider may recommend screening at age 66 if you are at increased risk. You will have tests every 1-10 years, depending on your results and the type of screening test. Diabetes screening. This is done by checking your blood sugar (glucose) after you have not eaten for a while (fasting). You may have this done every 1-3 years. Mammogram. This may be done every 1-2 years. Talk with your health care provider about when you should start having regular mammograms. This may depend on whether you have a family history of breast cancer. BRCA-related cancer screening. This may be done if you have a family history of breast, ovarian, tubal, or peritoneal cancers. Pelvic exam and Pap test. This may be done every 3 years starting at age 23. Starting at age 89, this may be done every 5 years if you have a Pap test in combination with an HPV test. Other tests STD (sexually transmitted disease) testing, if you are at risk. Bone density scan. This is done to screen for osteoporosis. You may have this scan if you are at high risk for osteoporosis. Talk with your health care provider about your test results, treatment options, and if necessary, the need for more tests. Follow these instructions at home: Eating and drinking  Eat a diet that includes fresh fruits and vegetables, whole grains, lean protein, and low-fat dairy products. Take vitamin and mineral supplements as recommended by your health care provider. Do not drink alcohol if: Your health care provider tells you not to drink. You are pregnant, may be pregnant,  or are planning to become pregnant. If you drink alcohol: Limit how much you have to 0-1 drink a day. Be aware of how much alcohol is in your drink. In the [place] one drink equals one 12 oz bottle of beer (355 mL), one 5 oz glass of wine (148  mL), or one 1 oz glass of hard liquor (44 mL). Lifestyle Take daily care of your teeth and gums. Brush your teeth every morning and night with fluoride toothpaste. Floss one time each day. Stay active. Exercise for at least 30 minutes 5 or more days each week. Do not use any products that contain nicotine or tobacco, such as cigarettes, e-cigarettes, and chewing tobacco. If you need help quitting, ask your health care provider. Do not use drugs. If you are sexually active, practice safe sex. Use a condom or other form of protection to prevent STIs (sexually transmitted infections). If you do not wish to become pregnant, use a form of birth control. If you plan to become pregnant, see your health care provider for a prepregnancy visit. If told by your health care provider, take low-dose aspirin daily starting at age 80. Find healthy ways to cope with stress, such as: Meditation, yoga, or listening to music. Journaling. Talking to a trusted person. Spending time with friends and family. Safety Always wear your seat belt while driving or riding in a vehicle. Do not drive: If you have been drinking alcohol. Do not ride with someone who has been drinking. When you are tired or distracted. While texting. Wear a helmet and other protective equipment during sports activities. If you have firearms in your house, make sure you follow all gun safety procedures. What's next? Visit your health care provider once a year for an annual wellness visit. Ask your health care provider how often you should have your eyes and teeth checked. Stay up to date on all vaccines. This information is not intended to replace advice given to you by your health care provider. Make sure you discuss any questions you have with your health care provider. Document Revised: 04/19/2020 Document Reviewed: 10/21/2017 Elsevier Patient Education  2022 [employer].

## 2020-11-29 ENCOUNTER — Encounter: Payer: Self-pay | Admitting: Family Medicine

## 2020-11-29 ENCOUNTER — Ambulatory Visit (INDEPENDENT_AMBULATORY_CARE_PROVIDER_SITE_OTHER): Payer: BC Managed Care – PPO | Admitting: Family Medicine

## 2020-11-29 ENCOUNTER — Other Ambulatory Visit: Payer: Self-pay

## 2020-11-29 VITALS — BP 118/78 | HR 80 | Temp 98.0°F | Ht 68.0 in | Wt 176.2 lb

## 2020-11-29 DIAGNOSIS — Z1211 Encounter for screening for malignant neoplasm of colon: Secondary | ICD-10-CM | POA: Diagnosis not present

## 2020-11-29 DIAGNOSIS — Z0000 Encounter for general adult medical examination without abnormal findings: Secondary | ICD-10-CM | POA: Diagnosis not present

## 2020-11-29 DIAGNOSIS — Z13220 Encounter for screening for lipoid disorders: Secondary | ICD-10-CM | POA: Diagnosis not present

## 2020-11-29 DIAGNOSIS — Z1329 Encounter for screening for other suspected endocrine disorder: Secondary | ICD-10-CM

## 2020-11-29 DIAGNOSIS — Z136 Encounter for screening for cardiovascular disorders: Secondary | ICD-10-CM | POA: Diagnosis not present

## 2020-11-30 LAB — CBC WITH DIFFERENTIAL/PLATELET
Basophils Absolute: 0 10*3/uL (ref 0.0–0.2)
Basos: 0 %
EOS (ABSOLUTE): 0 10*3/uL (ref 0.0–0.4)
Eos: 0 %
Hematocrit: 40.8 % (ref 34.0–46.6)
Hemoglobin: 13.9 g/dL (ref 11.1–15.9)
Immature Grans (Abs): 0 10*3/uL (ref 0.0–0.1)
Immature Granulocytes: 0 %
Lymphocytes Absolute: 2.6 10*3/uL (ref 0.7–3.1)
Lymphs: 29 %
MCH: 29.6 pg (ref 26.6–33.0)
MCHC: 34.1 g/dL (ref 31.5–35.7)
MCV: 87 fL (ref 79–97)
Monocytes Absolute: 0.8 10*3/uL (ref 0.1–0.9)
Monocytes: 9 %
Neutrophils Absolute: 5.6 10*3/uL (ref 1.4–7.0)
Neutrophils: 62 %
Platelets: 292 10*3/uL (ref 150–450)
RBC: 4.7 x10E6/uL (ref 3.77–5.28)
RDW: 12.1 % (ref 11.7–15.4)
WBC: 9.1 10*3/uL (ref 3.4–10.8)

## 2020-11-30 LAB — LIPID PANEL
Chol/HDL Ratio: 5.3 ratio — ABNORMAL HIGH (ref 0.0–4.4)
Cholesterol, Total: 175 mg/dL (ref 100–199)
HDL: 33 mg/dL — ABNORMAL LOW (ref >39)
LDL Chol Calc (NIH): 110 mg/dL — ABNORMAL HIGH (ref 0–99)
Triglycerides: 179 mg/dL — ABNORMAL HIGH (ref 0–149)
VLDL Cholesterol Cal: 32 mg/dL (ref 5–40)

## 2020-11-30 LAB — COMPREHENSIVE METABOLIC PANEL
ALT: 13 IU/L (ref 0–32)
AST: 14 IU/L (ref 0–40)
Albumin/Globulin Ratio: 1.5 (ref 1.2–2.2)
Albumin: 4.6 g/dL (ref 3.8–4.8)
Alkaline Phosphatase: 39 IU/L — ABNORMAL LOW (ref 44–121)
BUN/Creatinine Ratio: 18 (ref 9–23)
BUN: 13 mg/dL (ref 6–24)
Bilirubin Total: 0.3 mg/dL (ref 0.0–1.2)
CO2: 22 mmol/L (ref 20–29)
Calcium: 10.1 mg/dL (ref 8.7–10.2)
Chloride: 99 mmol/L (ref 96–106)
Creatinine, Ser: 0.74 mg/dL (ref 0.57–1.00)
Globulin, Total: 3 g/dL (ref 1.5–4.5)
Glucose: 89 mg/dL (ref 70–99)
Potassium: 4.6 mmol/L (ref 3.5–5.2)
Sodium: 138 mmol/L (ref 134–144)
Total Protein: 7.6 g/dL (ref 6.0–8.5)
eGFR: 100 mL/min/{1.73_m2} (ref >59)

## 2020-11-30 LAB — T3: T3, Total: 131 ng/dL (ref 71–180)

## 2020-11-30 LAB — T4, FREE: Free T4: 1.22 ng/dL (ref 0.82–1.77)

## 2020-11-30 LAB — TSH: TSH: 2.92 u[IU]/mL (ref 0.450–4.500)

## 2020-12-04 ENCOUNTER — Telehealth: Payer: Self-pay | Admitting: Family Medicine

## 2020-12-04 ENCOUNTER — Encounter: Payer: Self-pay | Admitting: Family Medicine

## 2020-12-04 DIAGNOSIS — Z30431 Encounter for routine checking of intrauterine contraceptive device: Secondary | ICD-10-CM

## 2020-12-04 NOTE — Telephone Encounter (Signed)
I am aware that we are not supposed to send messages back about taking on Vickie's pts but this pt called and said she was a former pt of yours at [facility] and wanted to see if you would be willing to take her on.

## 2020-12-04 NOTE — Telephone Encounter (Signed)
Pt called and said she needs a referral to another OBGYN because the one she had said she would be considered as a new pt. She needs to have her IUD replaced

## 2020-12-04 NOTE — Telephone Encounter (Signed)
That's fine--but she should see Dr. Redmond School as scheduled for Friday to evaluate her calf pain (or can see me tomorrow, if she is free for a sooner appointment).

## 2020-12-04 NOTE — Telephone Encounter (Signed)
Per Vickie called pt and scheduled appointment with Dr.Lalonde Friday morning, informed patient if worsens to go to ER she voices understanding with this, informed pt Vickie cannot place order without seeing her as any provider here cannot do that.  As far as OBGYN referral goes she needs new referral to have iud removed/ replaced she does not have a preference as to where referral goes, referral placed

## 2020-12-05 ENCOUNTER — Other Ambulatory Visit (HOSPITAL_BASED_OUTPATIENT_CLINIC_OR_DEPARTMENT_OTHER): Payer: Self-pay

## 2020-12-05 MED ORDER — INFLUENZA VAC SPLIT QUAD 0.5 ML IM SUSY
PREFILLED_SYRINGE | INTRAMUSCULAR | 0 refills | Status: DC
  Filled 2020-12-05: qty 0.5, 1d supply, fill #0

## 2020-12-06 ENCOUNTER — Ambulatory Visit: Payer: BC Managed Care – PPO | Admitting: Family Medicine

## 2021-03-04 ENCOUNTER — Other Ambulatory Visit (HOSPITAL_BASED_OUTPATIENT_CLINIC_OR_DEPARTMENT_OTHER): Payer: Self-pay

## 2021-03-04 ENCOUNTER — Encounter: Payer: Self-pay | Admitting: Family Medicine

## 2021-03-04 ENCOUNTER — Other Ambulatory Visit: Payer: Self-pay | Admitting: Family Medicine

## 2021-03-04 DIAGNOSIS — B001 Herpesviral vesicular dermatitis: Secondary | ICD-10-CM | POA: Insufficient documentation

## 2021-03-04 MED ORDER — VALACYCLOVIR HCL 1 G PO TABS
ORAL_TABLET | ORAL | 0 refills | Status: DC
  Filled 2021-03-04: qty 30, 8d supply, fill #0

## 2021-03-05 ENCOUNTER — Other Ambulatory Visit (HOSPITAL_BASED_OUTPATIENT_CLINIC_OR_DEPARTMENT_OTHER): Payer: Self-pay

## 2021-04-09 ENCOUNTER — Other Ambulatory Visit: Payer: Self-pay | Admitting: Family Medicine

## 2021-04-09 DIAGNOSIS — Z1231 Encounter for screening mammogram for malignant neoplasm of breast: Secondary | ICD-10-CM

## 2021-04-10 ENCOUNTER — Ambulatory Visit: Payer: BC Managed Care – PPO

## 2021-04-11 ENCOUNTER — Other Ambulatory Visit: Payer: Self-pay

## 2021-04-11 ENCOUNTER — Ambulatory Visit
Admission: RE | Admit: 2021-04-11 | Discharge: 2021-04-11 | Disposition: A | Discharge status: Home / Self Care | Payer: BC Managed Care – PPO | Source: Ambulatory Visit | Attending: Family Medicine | Admitting: Family Medicine

## 2021-04-11 DIAGNOSIS — Z1231 Encounter for screening mammogram for malignant neoplasm of breast: Secondary | ICD-10-CM | POA: Diagnosis not present

## 2021-04-21 ENCOUNTER — Other Ambulatory Visit: Payer: Self-pay

## 2021-04-21 ENCOUNTER — Other Ambulatory Visit (HOSPITAL_COMMUNITY)
Admission: RE | Admit: 2021-04-21 | Discharge: 2021-04-21 | Disposition: A | Discharge status: Home / Self Care | Payer: BC Managed Care – PPO | Source: Ambulatory Visit | Attending: Obstetrics and Gynecology | Admitting: Obstetrics and Gynecology

## 2021-04-21 ENCOUNTER — Ambulatory Visit (INDEPENDENT_AMBULATORY_CARE_PROVIDER_SITE_OTHER): Payer: BC Managed Care – PPO | Admitting: Obstetrics and Gynecology

## 2021-04-21 ENCOUNTER — Encounter: Payer: Self-pay | Admitting: Obstetrics and Gynecology

## 2021-04-21 VITALS — BP 108/70 | HR 91 | Ht 67.0 in | Wt 179.1 lb

## 2021-04-21 DIAGNOSIS — Z01419 Encounter for gynecological examination (general) (routine) without abnormal findings: Secondary | ICD-10-CM | POA: Diagnosis not present

## 2021-04-21 DIAGNOSIS — Z30433 Encounter for removal and reinsertion of intrauterine contraceptive device: Secondary | ICD-10-CM | POA: Diagnosis not present

## 2021-04-21 DIAGNOSIS — Z30430 Encounter for insertion of intrauterine contraceptive device: Secondary | ICD-10-CM | POA: Insufficient documentation

## 2021-04-21 MED ORDER — LEVONORGESTREL 20 MCG/DAY IU IUD
1.0000 | INTRAUTERINE_SYSTEM | Freq: Once | INTRAUTERINE | Status: AC
  Administered 2021-04-21: 1 via INTRAUTERINE

## 2021-04-21 NOTE — Progress Notes (Signed)
Subjective:     [name] is a 46 y.o. female P2 with BMI 28 who is here for a comprehensive physical exam. The patient reports no problems. She is sexually active using Mirena IUD for contraception. She reports amenorrhea with IUD. Patient denies any pelvic pain or abnormal results. She denies urinary incontinence. IUD is due to be removed and patient desires a re-insertion  Past Medical History:  Diagnosis Date   Asthma    IUD 08/2005   replaced 11/2010   Past Surgical History:  Procedure Laterality Date   BREAST EXCISIONAL BIOPSY Left 1996   benign   fibroadenoma removal of left breast  1996   INTRAUTERINE DEVICE INSERTION  07/07, 11/2010,11/2015   mirena   Family History  Problem Relation Age of Onset   Diabetes Mother    Diabetes Father    Diabetes Maternal Grandmother    Hypertension Maternal Grandmother    Stroke Maternal Grandmother    Cancer Maternal Grandfather    Cancer Paternal Grandmother        OVARIAN OR UTERINE CANCER   Breast cancer Maternal Aunt 80   Breast cancer Maternal Aunt 74    Social History   Socioeconomic History   Marital status: Married    Spouse name: Not on file   Number of children: Not on file   Years of education: Not on file   Highest education level: Not on file  Occupational History   Not on file  Tobacco Use   Smoking status: Never   Smokeless tobacco: Never  Vaping Use   Vaping Use: Never used  Substance and Sexual Activity   Alcohol use: No    Alcohol/week: 0.0 standard drinks   Drug use: No   Sexual activity: Yes    Birth control/protection: I.U.D., Implant    Comment: Mirena 11-2015  Other Topics Concern   Not on file  Social History Narrative   Not on file   Social Determinants of Health   Financial Resource Strain: Not on file  Food Insecurity: Not on file  Transportation Needs: Not on file  Physical Activity: Not on file  Stress: Not on file  Social Connections: Not on file  Intimate Partner  Violence: Not on file   Health Maintenance  Topic Date Due   HIV Screening  Never done   Hepatitis C Screening  Never done   COLONOSCOPY (Pts 45-73yrs Insurance coverage will need to be confirmed)  Never done   TETANUS/TDAP  10/08/2019   PAP SMEAR-Modifier  02/04/2020   COVID-19 Vaccine (4 - Booster for Pfizer series) 04/16/2020   INFLUENZA VACCINE  Never done   HPV VACCINES  Aged Out       Review of Systems Pertinent items noted in HPI and remainder of comprehensive ROS otherwise negative.   Objective:    GENERAL: Well-developed, well-nourished female in no acute distress.  HEENT: Normocephalic, atraumatic. Sclerae anicteric.  NECK: Supple. Normal thyroid.  LUNGS: Clear to auscultation bilaterally.  HEART: Regular rate and rhythm. BREASTS: Symmetric in size. No palpable masses or lymphadenopathy, skin changes, or nipple drainage. ABDOMEN: Soft, nontender, nondistended. No organomegaly. PELVIC: Normal external female genitalia. Vagina is pink and rugated.  Normal discharge. Normal appearing cervix with IUD strings visualized at the os. Uterus is normal in size. No adnexal mass or tenderness. Chaperone present during the pelvic exam EXTREMITIES: No cyanosis, clubbing, or edema, 2+ distal pulses.     Assessment:    Healthy female exam.      Plan:  Pap smear collected Normal mammogram 04/11/21 Health maintenance labs done by PCP Colonoscopy referral placed by PCP Patient declined STI screening Patient will be contacted with abnormal results  Patient also due for IUD removal and desires re-insertion GYNECOLOGY CLINIC PROCEDURE NOTE  [name] is a 46 y.o. [id number] here for IUD removal and re-insertion. No GYN concerns.    IUD Removal  Patient identified, informed consent performed, consent signed.  Patient was in the dorsal lithotomy position, normal external genitalia was noted.  A speculum was placed in the patient's vagina, normal discharge was noted, no  lesions. The cervix was visualized, no lesions, no abnormal discharge.  The strings of the IUD were grasped and pulled using ring forceps. The IUD was removed in its entirety. The cervix was then cleaned with Betadine x 2.  Grasped anteriorly with a single tooth tenaculum.  Mirena IUD placed per manufacturer's recommendations.  Strings trimmed to 3 cm. Tenaculum was removed, good hemostasis noted.  Patient tolerated procedure well.   Patient given post procedure instructions and Mirena care card with expiration date.  Patient is asked to check IUD strings periodically and follow up in 4-6 weeks for IUD check.    See After Visit Summary for Counseling Recommendations

## 2021-04-21 NOTE — Progress Notes (Signed)
New patient presents for pap smear, IUD removal/reinsertion. Declines STD screen, blood work. Consent signed. IUD placed, pt tolerated well. Ibuprofen offered, patient states she has her own in her purse.

## 2021-04-23 LAB — CYTOLOGY - PAP
Adequacy: ABSENT
Comment: NEGATIVE
Diagnosis: NEGATIVE
High risk HPV: NEGATIVE

## 2021-05-19 ENCOUNTER — Ambulatory Visit: Payer: BC Managed Care – PPO | Admitting: Obstetrics and Gynecology

## 2021-05-28 ENCOUNTER — Ambulatory Visit: Payer: BC Managed Care – PPO | Admitting: Obstetrics and Gynecology

## 2021-06-16 ENCOUNTER — Other Ambulatory Visit (HOSPITAL_BASED_OUTPATIENT_CLINIC_OR_DEPARTMENT_OTHER): Payer: Self-pay

## 2021-06-16 MED ORDER — CARESTART COVID-19 HOME TEST VI KIT
PACK | 0 refills | Status: DC
  Filled 2021-06-16: qty 4, 8d supply, fill #0

## 2021-08-01 ENCOUNTER — Other Ambulatory Visit (HOSPITAL_BASED_OUTPATIENT_CLINIC_OR_DEPARTMENT_OTHER): Payer: Self-pay

## 2021-08-01 ENCOUNTER — Other Ambulatory Visit: Payer: Self-pay | Admitting: Family Medicine

## 2021-08-01 DIAGNOSIS — B001 Herpesviral vesicular dermatitis: Secondary | ICD-10-CM

## 2021-08-01 MED ORDER — VALACYCLOVIR HCL 1 G PO TABS
ORAL_TABLET | ORAL | 0 refills | Status: DC
  Filled 2021-08-01: qty 30, 8d supply, fill #0
  Filled 2022-02-24: qty 30, 20d supply, fill #0

## 2021-08-01 MED ORDER — VALACYCLOVIR HCL 1 G PO TABS
1000.0000 mg | ORAL_TABLET | Freq: Three times a day (TID) | ORAL | 0 refills | Status: AC
  Filled 2021-08-01 (×2): qty 21, 7d supply, fill #0

## 2021-08-11 ENCOUNTER — Other Ambulatory Visit (HOSPITAL_BASED_OUTPATIENT_CLINIC_OR_DEPARTMENT_OTHER): Payer: Self-pay

## 2021-09-29 ENCOUNTER — Encounter: Payer: Self-pay | Admitting: Obstetrics and Gynecology

## 2021-09-29 NOTE — Progress Notes (Signed)
ERROR

## 2021-10-20 ENCOUNTER — Encounter: Payer: Self-pay | Admitting: Advanced Practice Midwife

## 2021-10-20 ENCOUNTER — Ambulatory Visit (INDEPENDENT_AMBULATORY_CARE_PROVIDER_SITE_OTHER): Payer: BC Managed Care – PPO | Admitting: Advanced Practice Midwife

## 2021-10-20 VITALS — BP 109/71 | HR 71 | Ht 67.0 in | Wt 180.5 lb

## 2021-10-20 DIAGNOSIS — Z803 Family history of malignant neoplasm of breast: Secondary | ICD-10-CM

## 2021-10-20 DIAGNOSIS — Z87898 Personal history of other specified conditions: Secondary | ICD-10-CM

## 2021-10-20 NOTE — Progress Notes (Signed)
46 y.o GYN presents for Genetic testing for Breast Cancer, her Mother was Dx in   Last PAP 04/21/2021 Last Mammogram 04/11/2021

## 2021-10-20 NOTE — Progress Notes (Signed)
   GYNECOLOGY PROGRESS NOTE  History:  46 y.o. G2P2002 presents to [place] office today for problem gyn visit. She reports her mother was dx with breast cancer a few months ago, and her mother's 2 sisters also had breast cancer over the age of 2.  She has a personal hx of breast adenomas in her 88s and 42s but recent mammograms have not mentioned them.  She wants to have genetic testing for BRCA and discuss recommended follow up (additional mammograms, etc) due to her increased risk.      The following portions of the patient's history were reviewed and updated as appropriate: allergies, current medications, past family history, past medical history, past social history, past surgical history and problem list. Last pap smear on 04/21/21 was normal, negative HRHPV.  Health Maintenance Due  Topic Date Due   HIV Screening  Never done   Hepatitis C Screening  Never done   COLONOSCOPY (Pts 45-80yrs Insurance coverage will need to be confirmed)  Never done   TETANUS/TDAP  10/08/2019   COVID-19 Vaccine (4 - Pfizer series) 04/16/2020   INFLUENZA VACCINE  09/23/2021     Review of Systems:  Pertinent items are noted in HPI.   Objective:  Physical Exam Blood pressure 109/71, pulse 71, height $RemoveBe'5\' 7"'[id number]$  (1.702 m), weight 180 lb 8 oz (81.9 kg). VS reviewed, nursing note reviewed,  Constitutional: well developed, well nourished, no distress HEENT: normocephalic CV: normal rate Pulm/chest wall: normal effort Breast Exam: deferred Abdomen: soft Neuro: alert and oriented x 3 Skin: warm, dry Psych: affect normal Pelvic exam: Deferred  Assessment & Plan:  1. Family history of breast cancer in first degree relative --Reviewed family hx, questionnaire about risk from [place] performed. --Discussed how life/health insurance may be able to use positive BRCA testing and increase pt costs. --Pt desires testing today so EMPOWER test drawn  -Refer to genetics for counseling  2. History of lump in  breast -Pt with personal hx breast adenomas when she was younger.  Her recent mammograms have not mentioned them. She wants to know if these put her at higher risk or if she should have another mammogram this year based on her mother's diagnosis. She denies any breast problems, and has not felt anything abnormal on her exams.  --Breast adenomas often shrink with age/menopause so are likely resolved with normal mammogram in February 2023.   --Changes to screening for breast cancer depend on BRCA results so will discuss recommendations when EMPOWER test returns.     No follow-ups on file.   Fatima Blank, CNM 1:40 PM

## 2021-10-29 ENCOUNTER — Encounter: Payer: Self-pay | Admitting: Internal Medicine

## 2021-10-30 ENCOUNTER — Encounter: Payer: Self-pay | Admitting: Advanced Practice Midwife

## 2021-12-02 ENCOUNTER — Encounter: Payer: Self-pay | Admitting: Internal Medicine

## 2021-12-19 ENCOUNTER — Other Ambulatory Visit (HOSPITAL_BASED_OUTPATIENT_CLINIC_OR_DEPARTMENT_OTHER): Payer: Self-pay

## 2021-12-19 MED ORDER — FLUARIX QUADRIVALENT 0.5 ML IM SUSY
PREFILLED_SYRINGE | INTRAMUSCULAR | 0 refills | Status: DC
  Filled 2021-12-19: qty 0.5, 1d supply, fill #0

## 2022-02-24 ENCOUNTER — Other Ambulatory Visit (HOSPITAL_BASED_OUTPATIENT_CLINIC_OR_DEPARTMENT_OTHER): Payer: Self-pay

## 2022-02-24 MED ORDER — AZITHROMYCIN 250 MG PO TABS
ORAL_TABLET | ORAL | 0 refills | Status: AC
  Filled 2022-02-24: qty 6, 5d supply, fill #0

## 2022-02-26 ENCOUNTER — Other Ambulatory Visit (HOSPITAL_BASED_OUTPATIENT_CLINIC_OR_DEPARTMENT_OTHER): Payer: Self-pay

## 2022-03-04 ENCOUNTER — Other Ambulatory Visit: Payer: Self-pay | Admitting: Family Medicine

## 2022-03-04 DIAGNOSIS — Z1231 Encounter for screening mammogram for malignant neoplasm of breast: Secondary | ICD-10-CM

## 2022-04-27 ENCOUNTER — Ambulatory Visit
Admission: RE | Admit: 2022-04-27 | Discharge: 2022-04-27 | Disposition: A | Discharge status: Home / Self Care | Payer: BC Managed Care – PPO | Source: Ambulatory Visit | Attending: Family Medicine | Admitting: Family Medicine

## 2022-04-27 DIAGNOSIS — Z1231 Encounter for screening mammogram for malignant neoplasm of breast: Secondary | ICD-10-CM

## 2022-05-03 NOTE — Patient Instructions (Incomplete)
  HEALTH MAINTENANCE RECOMMENDATIONS:  It is recommended that you get at least 30 minutes of aerobic exercise at least 5 days/week (for weight loss, you may need as much as 60-90 minutes). This can be any activity that gets your heart rate up. This can be divided in 10-15 minute intervals if needed, but try and build up your endurance at least once a week.  Weight bearing exercise is also recommended twice weekly.  Eat a healthy diet with lots of vegetables, fruits and fiber.  "Colorful" foods have a lot of vitamins (ie green vegetables, tomatoes, red peppers, etc).  Limit sweet tea, regular sodas and alcoholic beverages, all of which has a lot of calories and sugar.  Up to 1 alcoholic drink daily may be beneficial for women (unless trying to lose weight, watch sugars).  Drink a lot of water.  Calcium recommendations are 1200-1500 mg daily (1500 mg for postmenopausal women or women without ovaries), and vitamin D 1000 IU daily.  This should be obtained from diet and/or supplements (vitamins), and calcium should not be taken all at once, but in divided doses.  Monthly self breast exams and yearly mammograms for women over the age of 93 is recommended.  Sunscreen of at least SPF 30 should be used on all sun-exposed parts of the skin when outside between the hours of 10 am and 4 pm (not just when at beach or pool, but even with exercise, golf, tennis, and yard work!)  Use a sunscreen that says "broad spectrum" so it covers both UVA and UVB rays, and make sure to reapply every 1-2 hours.  Remember to change the batteries in your smoke detectors when changing your clock times in the spring and fall. Carbon monoxide detectors are recommended for your home.  Use your seat belt every time you are in a car, and please drive safely and not be distracted with cell phones and texting while driving.  Please call [facility] to set up your colonoscopy (you'll need an appointment scheduled with the nurse first). If  you have a problem, or need another referral, let us know.  Please set up regular dental cleanings.

## 2022-05-03 NOTE — Progress Notes (Unsigned)
No chief complaint on file.  [name] Age is a 47 y.o. female who presents for a complete physical.  I have not seen her since I was with [facility] many years ago, but she was under the care of NP Vickie for a while.  Last seen in this office in 11/2020 for CPE with Vickie.  She had GYN exam 03/2021. IUD was removed and another was inserted at that visit.  She has the following concerns:  Family history of breast cancer.  This was discussed in detail with her OB-GYN in 09/2021, and she had negative genetic testing. Breast cancer was diagnosed in her mother last year, and her mother's 2 sisters also had breast cancer (>age 10).  Patient has h/o breast adenomas in 20's, 30's, but normal mammograms since.   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  Asthma?   Immunization History  Administered Date(s) Administered   Hepatitis A 05/28/2000   Hepatitis B 06/19/1999, 05/28/2000, 01/10/2004, 09/08/2005   Influenza,inj,Quad PF,6+ Mos 12/19/2021   MMR 06/19/1999   Meningococcal Conjugate 02/01/2014   PFIZER Comirnaty(Gray Top)Covid-19 Tri-Sucrose Vaccine 03/10/2019, 03/30/2019   PFIZER(Purple Top)SARS-COV-2 Vaccination 02/20/2020   Td 06/19/1999, 06/06/2002, 09/08/2005, 10/07/2009   Tdap 10/07/2009   Varicella 04/24/2005   Last Pap smear: 03/2021 (NILM, neg HPV) Last mammogram: 04/27/22 Last colonoscopy: Never. Pt was referred to GI 11/2020. Referral states "Spoke with patient she would like to schedule for Jan 2023 will call back once calender is released." This was never scheduled. Last DEXA: never Dentist: Ophtho: Exercise:  Lipids: last checked 11/2020, with low HDL, elevated TG and ratio. Due for recheck. Current diet  ***UPDATE  Lab Results  Component Value Date   CHOL 175 11/29/2020   HDL 33 (L) 11/29/2020   LDLCALC 110 (H) 11/29/2020   TRIG 179 (H) 11/29/2020   CHOLHDL 5.3 (H) 11/29/2020   She had normal cbc, c-met and TSH in 11/2020.  Vitamin D-OH  screening was normal at 40 in 10/2010.    PMH, PSH, SH and FH were reviewed and updated    ROS:  The patient denies anorexia, fever, weight changes, headaches,  vision changes, decreased hearing, ear pain, sore throat, breast concerns, chest pain, palpitations, dizziness, syncope, dyspnea on exertion, cough, swelling, nausea, vomiting, diarrhea, constipation, abdominal pain, melena, hematochezia, indigestion/heartburn, hematuria, incontinence, dysuria, irregular menstrual cycles, vaginal discharge, odor or itch, genital lesions, joint pains, numbness, tingling, weakness, tremor, suspicious skin lesions, depression, anxiety, abnormal bleeding/bruising, or enlarged lymph nodes.  UPDATE ALL   PHYSICAL EXAM:  There were no vitals taken for this visit.  Wt Readings from Last 3 Encounters:  10/20/21 180 lb 8 oz (81.9 kg)  04/21/21 179 lb 1.6 oz (81.2 kg)  11/29/20 176 lb 3.2 oz (79.9 kg)   General Appearance:    Alert, cooperative, no distress, appears stated age  Head:    Normocephalic, without obvious abnormality, atraumatic  Eyes:    PERRL, conjunctiva/corneas clear, EOM's intact  Ears:    Normal TM's and external ear canals  Nose:   No nasal drainage or sinus tenderness  Throat:   Normal mucosa   Neck:   Supple, no lymphadenopathy;  thyroid:  no enlargement/ tenderness/nodules  Back:    Spine nontender, no curvature, ROM normal, no CVA  tenderness  Lungs:     Clear to auscultation bilaterally without wheezes, rales or  ronchi; respirations unlabored  Chest Wall:    No tenderness or deformity   Heart:    Regular  rate and rhythm, S1 and S2 normal, no murmur, rub or gallop  Breast Exam:    Deferred to OB-GYN  Abdomen:     Soft, non-tender, nondistended, normoactive bowel sounds, no masses, no hepatosplenomegaly  Genitalia:    Deferred to OB/GYN       Extremities:   No clubbing, cyanosis or edema  Pulses:   2+ and symmetric all extremities  Skin:   Skin color, texture, turgor normal,  no rashes or lesions  Lymph nodes:   Cervical, supraclavicular, and axillary nodes normal  Neurologic:   CNII-XII intact, normal strength, sensation and gait                    Psych:   Normal mood, affect, hygiene and grooming.                  ASSESSMENT/PLAN:   ?any tetanus boosters through employee health or GYN in the last few years? (Last 2011) If so, need to get records from employee health. Have her sign ROR. I also want to know if she had varicella titers (only had 1 vaccine, not two).  It says she has asthma?   If so, pneumovax is indicated.  Not sure of her asthma dx.  Has never had spirometry. I just see rx for albuterol, never filled by [place]  Give her the number to contact [facility] directly (was referred in 11/2020, pt never called back to schedule). Vs Cologuard if doesn't want colonoscopy and no FHx polyps/cancer  Cbc, c-met, lipids. TSH if sx Consider D if not taking supplements   Discussed monthly self breast exams and yearly mammograms; at least 30 minutes of aerobic activity at least 5 days/week, weight-bearing exercise at least 2x/week; proper sunscreen use reviewed; healthy diet, including goals of calcium and vitamin D intake and alcohol recommendations (less than or equal to 1 drink/day) reviewed; regular seatbelt use; changing batteries in smoke detectors.  Immunization recommendations discussed. Continue yearly flu shots. COVID booster TdaP  Colonoscopy recommendations reviewed, past due.  Reminded to contact [facility] to schedule colonoscopy.

## 2022-05-04 ENCOUNTER — Encounter: Payer: Self-pay | Admitting: Family Medicine

## 2022-05-04 ENCOUNTER — Ambulatory Visit (INDEPENDENT_AMBULATORY_CARE_PROVIDER_SITE_OTHER): Payer: BC Managed Care – PPO | Admitting: Family Medicine

## 2022-05-04 ENCOUNTER — Other Ambulatory Visit (HOSPITAL_BASED_OUTPATIENT_CLINIC_OR_DEPARTMENT_OTHER): Payer: Self-pay

## 2022-05-04 VITALS — BP 100/60 | HR 64 | Ht 67.0 in | Wt 182.0 lb

## 2022-05-04 DIAGNOSIS — Z6828 Body mass index (BMI) 28.0-28.9, adult: Secondary | ICD-10-CM

## 2022-05-04 DIAGNOSIS — B001 Herpesviral vesicular dermatitis: Secondary | ICD-10-CM | POA: Diagnosis not present

## 2022-05-04 DIAGNOSIS — R140 Abdominal distension (gaseous): Secondary | ICD-10-CM

## 2022-05-04 DIAGNOSIS — E785 Hyperlipidemia, unspecified: Secondary | ICD-10-CM | POA: Diagnosis not present

## 2022-05-04 DIAGNOSIS — Z1211 Encounter for screening for malignant neoplasm of colon: Secondary | ICD-10-CM

## 2022-05-04 DIAGNOSIS — Z0000 Encounter for general adult medical examination without abnormal findings: Secondary | ICD-10-CM | POA: Diagnosis not present

## 2022-05-04 DIAGNOSIS — Z803 Family history of malignant neoplasm of breast: Secondary | ICD-10-CM

## 2022-05-04 DIAGNOSIS — J4520 Mild intermittent asthma, uncomplicated: Secondary | ICD-10-CM

## 2022-05-04 MED ORDER — VALACYCLOVIR HCL 1 G PO TABS
ORAL_TABLET | ORAL | 0 refills | Status: DC
  Filled 2022-05-04: qty 30, 7d supply, fill #0

## 2022-05-04 MED ORDER — ALBUTEROL SULFATE HFA 108 (90 BASE) MCG/ACT IN AERS
2.0000 | INHALATION_SPRAY | Freq: Four times a day (QID) | RESPIRATORY_TRACT | 1 refills | Status: AC | PRN
  Filled 2022-05-04: qty 6.7, 21d supply, fill #0

## 2022-05-05 LAB — COMPREHENSIVE METABOLIC PANEL
ALT: 11 IU/L (ref 0–32)
AST: 17 IU/L (ref 0–40)
Albumin/Globulin Ratio: 1.5 (ref 1.2–2.2)
Albumin: 4.6 g/dL (ref 3.9–4.9)
Alkaline Phosphatase: 45 IU/L (ref 44–121)
BUN/Creatinine Ratio: 14 (ref 9–23)
BUN: 11 mg/dL (ref 6–24)
Bilirubin Total: 0.4 mg/dL (ref 0.0–1.2)
CO2: 23 mmol/L (ref 20–29)
Calcium: 9.7 mg/dL (ref 8.7–10.2)
Chloride: 103 mmol/L (ref 96–106)
Creatinine, Ser: 0.78 mg/dL (ref 0.57–1.00)
Globulin, Total: 3 g/dL (ref 1.5–4.5)
Glucose: 88 mg/dL (ref 70–99)
Potassium: 4.5 mmol/L (ref 3.5–5.2)
Sodium: 140 mmol/L (ref 134–144)
Total Protein: 7.6 g/dL (ref 6.0–8.5)
eGFR: 94 mL/min/{1.73_m2} (ref >59)

## 2022-05-05 LAB — CBC WITH DIFFERENTIAL/PLATELET
Basophils Absolute: 0 10*3/uL (ref 0.0–0.2)
Basos: 1 %
EOS (ABSOLUTE): 0 10*3/uL (ref 0.0–0.4)
Eos: 0 %
Hematocrit: 41.8 % (ref 34.0–46.6)
Hemoglobin: 14.2 g/dL (ref 11.1–15.9)
Immature Grans (Abs): 0 10*3/uL (ref 0.0–0.1)
Immature Granulocytes: 0 %
Lymphocytes Absolute: 2.7 10*3/uL (ref 0.7–3.1)
Lymphs: 36 %
MCH: 29.5 pg (ref 26.6–33.0)
MCHC: 34 g/dL (ref 31.5–35.7)
MCV: 87 fL (ref 79–97)
Monocytes Absolute: 0.5 10*3/uL (ref 0.1–0.9)
Monocytes: 6 %
Neutrophils Absolute: 4.3 10*3/uL (ref 1.4–7.0)
Neutrophils: 57 %
Platelets: 277 10*3/uL (ref 150–450)
RBC: 4.82 x10E6/uL (ref 3.77–5.28)
RDW: 12.1 % (ref 11.7–15.4)
WBC: 7.6 10*3/uL (ref 3.4–10.8)

## 2022-05-05 LAB — LIPID PANEL
Chol/HDL Ratio: 5 ratio — ABNORMAL HIGH (ref 0.0–4.4)
Cholesterol, Total: 181 mg/dL (ref 100–199)
HDL: 36 mg/dL — ABNORMAL LOW (ref >39)
LDL Chol Calc (NIH): 127 mg/dL — ABNORMAL HIGH (ref 0–99)
Triglycerides: 95 mg/dL (ref 0–149)
VLDL Cholesterol Cal: 18 mg/dL (ref 5–40)

## 2022-05-29 ENCOUNTER — Telehealth: Payer: Self-pay | Admitting: *Deleted

## 2022-05-29 ENCOUNTER — Encounter: Payer: Self-pay | Admitting: *Deleted

## 2022-05-29 NOTE — Telephone Encounter (Signed)
Called patient to let her know she is due for Tdap. She can scheduled NV or get through occ health at [facility].

## 2022-06-29 ENCOUNTER — Encounter: Payer: Self-pay | Admitting: Family Medicine

## 2022-06-29 ENCOUNTER — Other Ambulatory Visit: Payer: Self-pay | Admitting: *Deleted

## 2022-06-29 DIAGNOSIS — Z1211 Encounter for screening for malignant neoplasm of colon: Secondary | ICD-10-CM

## 2022-06-29 NOTE — Telephone Encounter (Signed)
Placed referral for patient per your last note from her CPE 3/24. (Ok to place referral, if needed)

## 2022-06-30 ENCOUNTER — Encounter: Payer: Self-pay | Admitting: Internal Medicine

## 2022-07-23 ENCOUNTER — Ambulatory Visit (AMBULATORY_SURGERY_CENTER): Payer: BC Managed Care – PPO

## 2022-07-23 VITALS — Ht 68.0 in | Wt 180.0 lb

## 2022-07-23 DIAGNOSIS — Z1211 Encounter for screening for malignant neoplasm of colon: Secondary | ICD-10-CM

## 2022-07-23 MED ORDER — NA SULFATE-K SULFATE-MG SULF 17.5-3.13-1.6 GM/177ML PO SOLN: 1.0000 | Freq: Once | ORAL | 0 refills | Status: AC

## 2022-07-23 NOTE — Progress Notes (Signed)
Pre visit completed via phone call; Patient verified name, DOB, and address;  No egg or soy allergy known to patient;  No issues known to pt with past sedation with any surgeries or procedures; Patient denies ever being told they had issues or difficulty with intubation;  No FH of Malignant Hyperthermia; Pt is not on diet pills; Pt is not on home 02;  Pt is not on blood thinners;  Pt reports issues with constipation --patient reports she has had issues since childhood-patient advised to increase  No A fib or A flutter; Have any cardiac testing pending--NO Pt instructed to use [email or web address] or GoodRx for a price reduction on prep;   Insurance verified during PV appt=BCBS PPO  Patient's chart reviewed by [name] CNRA prior to previsit and patient appropriate for the [facility].  Previsit completed and red dot placed by patient's name on their procedure day (on provider's schedule).    Instructions sent to patient's MyChart per her request; GoodRx info sent in RX;

## 2022-09-14 ENCOUNTER — Encounter: Payer: Self-pay | Admitting: Gastroenterology

## 2022-09-17 ENCOUNTER — Encounter: Payer: BC Managed Care – PPO | Admitting: Internal Medicine

## 2022-09-22 ENCOUNTER — Encounter: Payer: Self-pay | Admitting: Gastroenterology

## 2022-09-22 ENCOUNTER — Ambulatory Visit (AMBULATORY_SURGERY_CENTER): Payer: BC Managed Care – PPO | Admitting: Gastroenterology

## 2022-09-22 VITALS — BP 98/60 | HR 66 | Temp 98.2°F | Resp 19 | Ht 68.0 in | Wt 180.0 lb

## 2022-09-22 DIAGNOSIS — Z1211 Encounter for screening for malignant neoplasm of colon: Secondary | ICD-10-CM

## 2022-09-22 DIAGNOSIS — D120 Benign neoplasm of cecum: Secondary | ICD-10-CM

## 2022-09-22 MED ORDER — SODIUM CHLORIDE 0.9 % IV SOLN: 500.0000 mL | Freq: Once | INTRAVENOUS | Status: DC

## 2022-09-22 NOTE — Op Note (Signed)
[facility] Patient Name: [name] Procedure Date: 09/22/2022 8:34 AM MRN: [id number] Endoscopist: Napoleon Form , MD, [id number] Age: 47 Referring MD:  Date of Birth: [birth year] Gender: Female Account #: [id number] Procedure:                Colonoscopy Indications:              Screening for colorectal malignant neoplasm Medicines:                Propofol per Anesthesia Procedure:                Pre-Anesthesia Assessment:                           - Prior to the procedure, a History and Physical                            was performed, and patient medications and                            allergies were reviewed. The patient's tolerance of                            previous anesthesia was also reviewed. The risks                            and benefits of the procedure and the sedation                            options and risks were discussed with the patient.                            All questions were answered, and informed consent                            was obtained. Prior Anticoagulants: The patient has                            taken no anticoagulant or antiplatelet agents. ASA                            Grade Assessment: II - A patient with mild systemic                            disease. After reviewing the risks and benefits,                            the patient was deemed in satisfactory condition to                            undergo the procedure.                           After obtaining informed consent, the colonoscope  was passed under direct vision. Throughout the                            procedure, the patient's blood pressure, pulse, and                            oxygen saturations were monitored continuously. The                            Olympus PCF-H190DL (#[id number]) Colonoscope was                            introduced through the anus and advanced to the the                            cecum,  identified by appendiceal orifice and                            ileocecal valve. The colonoscopy was performed                            without difficulty. The patient tolerated the                            procedure well. The quality of the bowel                            preparation was adequate. The ileocecal valve,                            appendiceal orifice, and rectum were photographed. Scope In: 8:43:18 AM Scope Out: 9:11:30 AM Scope Withdrawal Time: 0 hours 9 minutes 22 seconds  Total Procedure Duration: 0 hours 28 minutes 12 seconds  Findings:                 The perianal and digital rectal examinations were                            normal.                           A 1 mm polyp was found in the cecum. The polyp was                            sessile. The polyp was removed with a cold biopsy                            forceps. Resection and retrieval were complete.                           Non-bleeding internal hemorrhoids were found during                            retroflexion. The hemorrhoids were medium-sized. Complications:  No immediate complications. Estimated Blood Loss:     Estimated blood loss was minimal. Impression:               - One 1 mm polyp in the cecum, removed with a cold                            biopsy forceps. Resected and retrieved.                           - Non-bleeding internal hemorrhoids. Recommendation:           - Patient has a contact number available for                            emergencies. The signs and symptoms of potential                            delayed complications were discussed with the                            patient. Return to normal activities tomorrow.                            Written discharge instructions were provided to the                            patient.                           - Resume previous diet.                           - Continue present medications.                           -  Await pathology results.                           - Repeat colonoscopy in 5-10 years for surveillance                            based on pathology results. Napoleon Form, MD 09/22/2022 9:19:22 AM This report has been signed electronically.

## 2022-09-22 NOTE — Patient Instructions (Signed)
Handout on polyps given.  Resume previous diet, continue present medications.     YOU HAD AN ENDOSCOPIC PROCEDURE TODAY AT THE [facility]:   Refer to the procedure report that was given to you for any specific questions about what was found during the examination.  If the procedure report does not answer your questions, please call your gastroenterologist to clarify.  If you requested that your care partner not be given the details of your procedure findings, then the procedure report has been included in a sealed envelope for you to review at your convenience later.  YOU SHOULD EXPECT: Some feelings of bloating in the abdomen. Passage of more gas than usual.  Walking can help get rid of the air that was put into your GI tract during the procedure and reduce the bloating. If you had a lower endoscopy (such as a colonoscopy or flexible sigmoidoscopy) you may notice spotting of blood in your stool or on the toilet paper. If you underwent a bowel prep for your procedure, you may not have a normal bowel movement for a few days.  Please Note:  You might notice some irritation and congestion in your nose or some drainage.  This is from the oxygen used during your procedure.  There is no need for concern and it should clear up in a day or so.  SYMPTOMS TO REPORT IMMEDIATELY:  Following lower endoscopy (colonoscopy or flexible sigmoidoscopy):  Excessive amounts of blood in the stool  Significant tenderness or worsening of abdominal pains  Swelling of the abdomen that is new, acute  Fever of 100F or higher   For urgent or emergent issues, a gastroenterologist can be reached at any hour by calling [phone]. Do not use MyChart messaging for urgent concerns.    DIET:  We do recommend a small meal at first, but then you may proceed to your regular diet.  Drink plenty of fluids but you should avoid alcoholic beverages for 24 hours.  ACTIVITY:  You should plan to take it easy for the  rest of today and you should NOT DRIVE or use heavy machinery until tomorrow (because of the sedation medicines used during the test).    FOLLOW UP: Our staff will call the number listed on your records the next business day following your procedure.  We will call around 7:15- 8:00 am to check on you and address any questions or concerns that you may have regarding the information given to you following your procedure. If we do not reach you, we will leave a message.     If any biopsies were taken you will be contacted by phone or by letter within the next 1-3 weeks.  Please call us at [phone] if you have not heard about the biopsies in 3 weeks.    SIGNATURES/CONFIDENTIALITY: You and/or your care partner have signed paperwork which will be entered into your electronic medical record.  These signatures attest to the fact that that the information above on your After Visit Summary has been reviewed and is understood.  Full responsibility of the confidentiality of this discharge information lies with you and/or your care-partner.

## 2022-09-22 NOTE — Progress Notes (Signed)
Uneventful anesthetic. Cultural requests followed. Report to pacu rn. Vss. Care resumed by rn.

## 2022-09-22 NOTE — Progress Notes (Signed)
[facility] History and Physical   Primary Care Physician:  Joselyn Arrow, MD   Reason for Procedure:  Colorectal cancer screening  Plan:    Screening colonoscopy with possible interventions as needed     HPI: [name] is a very pleasant 47 y.o. female here for screening colonoscopy. Denies any nausea, vomiting, abdominal pain, melena or bright red blood per rectum  The risks and benefits as well as alternatives of endoscopic procedure(s) have been discussed and reviewed. All questions answered. The patient agrees to proceed.    Past Medical History:  Diagnosis Date   Anxiety    situational anxiety   Asthma    has inhaler PRN   Herpes labialis    IUD 08/2005   replaced 11/2010   PONV (postoperative nausea and vomiting)     Past Surgical History:  Procedure Laterality Date   BREAST EXCISIONAL BIOPSY Left 1996   benign   fibroadenoma removal of left breast Left 1996   INTRAUTERINE DEVICE INSERTION  07/07, 11/2010,11/2015   mirena    Prior to Admission medications   Medication Sig Start Date End Date Taking? Authorizing Provider  B Complex Vitamins (B COMPLEX 1 PO) Take 1 tablet by mouth daily.   Yes [provider]  Cholecalciferol (VITAMIN D-3) 125 MCG (5000 UT) TABS Take 1 tablet by mouth once a week.   Yes [provider]  Magnesium 400 MG TABS Take 1 tablet by mouth at bedtime.   Yes [provider]  albuterol (VENTOLIN HFA) 108 (90 Base) MCG/ACT inhaler Inhale 2 puffs into the lungs every 6 (six) hours as needed for wheezing or shortness of breath. 05/04/22   Joselyn Arrow, MD  valACYclovir (VALTREX) 1000 MG tablet Take 2 tablets by mouth and repeat in 12 hours, as needed for outbreak. (Total course 4 tablets) 05/04/22   Joselyn Arrow, MD    Current Outpatient Medications  Medication Sig Dispense Refill   B Complex Vitamins (B COMPLEX 1 PO) Take 1 tablet by mouth daily.     Cholecalciferol (VITAMIN D-3) 125 MCG (5000 UT)  TABS Take 1 tablet by mouth once a week.     Magnesium 400 MG TABS Take 1 tablet by mouth at bedtime.     albuterol (VENTOLIN HFA) 108 (90 Base) MCG/ACT inhaler Inhale 2 puffs into the lungs every 6 (six) hours as needed for wheezing or shortness of breath. 6.7 g 1   valACYclovir (VALTREX) 1000 MG tablet Take 2 tablets by mouth and repeat in 12 hours, as needed for outbreak. (Total course 4 tablets) 30 tablet 0   Current Facility-Administered Medications  Medication Dose Route Frequency Provider Last Rate Last Admin   0.9 %  sodium chloride infusion  500 mL Intravenous Once [place] Yepiz, Eleonore Chiquito, MD       levonorgestrel (MIRENA) 20 MCG/24HR IUD   Intrauterine Once Fontaine, Nadyne Coombes, MD        Allergies as of 09/22/2022 - Review Complete 09/22/2022  Allergen Reaction Noted   Iohexol  09/16/2009    Family History  Problem Relation Age of Onset   Diabetes Mother    Breast cancer Mother 19   Hypertension Mother    Diabetes Father    Breast cancer Maternal Aunt 47   Breast cancer Maternal Aunt 74   Diabetes Maternal Grandmother    Hypertension Maternal Grandmother    Stroke Maternal Grandmother    Cancer Maternal Grandfather    Cancer Paternal Grandmother  OVARIAN OR UTERINE CANCER   Colon polyps Neg Hx    Colon cancer Neg Hx    Esophageal cancer Neg Hx    Stomach cancer Neg Hx    Rectal cancer Neg Hx     Social History   Socioeconomic History   Marital status: Married    Spouse name: Not on file   Number of children: Not on file   Years of education: Not on file   Highest education level: Not on file  Occupational History   Not on file  Tobacco Use   Smoking status: Never   Smokeless tobacco: Never  Vaping Use   Vaping status: Never Used  Substance and Sexual Activity   Alcohol use: No    Alcohol/week: 0.0 standard drinks of alcohol   Drug use: No   Sexual activity: Yes    Birth control/protection: I.U.D.    Comment: Mirena 11-2015  Other Topics Concern    Not on file  Social History Narrative   Married, 2 sons, 1 dog "Rocky".   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      From [place] (in [place] since 2001).      Updated 04/2022   Social Determinants of Health   Financial Resource Strain: Not on file  Food Insecurity: Not on file  Transportation Needs: Not on file  Physical Activity: Not on file  Stress: Not on file  Social Connections: Not on file  Intimate Partner Violence: Not on file    Review of Systems:  All other review of systems negative except as mentioned in the HPI.  Physical Exam: Vital signs in last 24 hours: BP 107/66   Pulse 65   Temp 98.2 F (36.8 C)   Ht 5\' 8"  (1.727 m)   Wt 180 lb (81.6 kg)   SpO2 98%   BMI 27.37 kg/m  General:   Alert, NAD Lungs:  Clear .   Heart:  Regular rate and rhythm Abdomen:  Soft, nontender and nondistended. Neuro/Psych:  Alert and cooperative. Normal mood and affect. A and O x 3  Reviewed labs, radiology imaging, old records and pertinent past GI work up  Patient is appropriate for planned procedure(s) and anesthesia in an ambulatory setting   K. Scherry Ran , MD [phone]

## 2022-09-22 NOTE — Progress Notes (Signed)
VS by SM  Pt's states no medical or surgical changes since previsit or office visit.  

## 2022-09-22 NOTE — Progress Notes (Signed)
Called to room to assist during endoscopic procedure.  Patient ID and intended procedure confirmed with present staff. Received instructions for my participation in the procedure from the performing physician.  

## 2022-09-23 ENCOUNTER — Telehealth: Payer: Self-pay

## 2022-09-23 NOTE — Telephone Encounter (Signed)
Attempted f/u call. No answer, left VM. 

## 2022-09-25 ENCOUNTER — Encounter: Payer: Self-pay | Admitting: Gastroenterology

## 2022-12-17 ENCOUNTER — Other Ambulatory Visit (HOSPITAL_BASED_OUTPATIENT_CLINIC_OR_DEPARTMENT_OTHER): Payer: Self-pay

## 2022-12-17 MED ORDER — FLULAVAL 0.5 ML IM SUSY
0.5000 mL | PREFILLED_SYRINGE | Freq: Once | INTRAMUSCULAR | 0 refills | Status: AC
  Filled 2022-12-17: qty 0.5, 1d supply, fill #0

## 2023-02-21 IMAGING — MG MM DIGITAL SCREENING BILAT W/ TOMO AND CAD
8 series · 8 of 24 positions shown · non-contrast
Comparison: Previous exam(s).

CLINICAL DATA: Screening.

EXAM:
DIGITAL SCREENING BILATERAL MAMMOGRAM WITH TOMOSYNTHESIS AND CAD
TECHNIQUE: Bilateral screening digital craniocaudal and mediolateral oblique
mammograms were obtained. Bilateral screening digital breast
tomosynthesis was performed. The images were evaluated with
computer-aided detection.

[R MLO synth-2D]
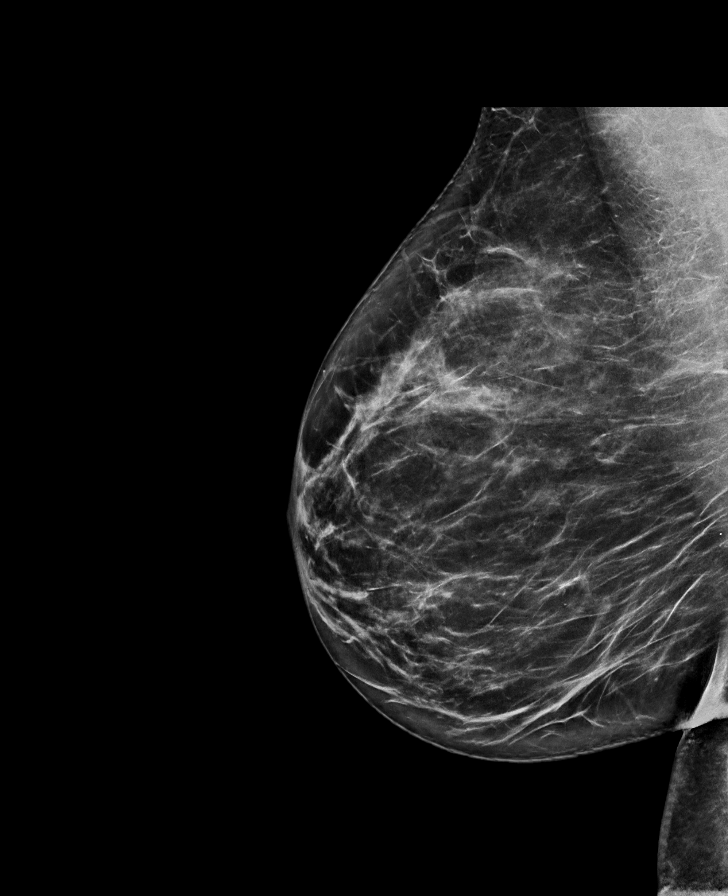

[L CC synth-2D]
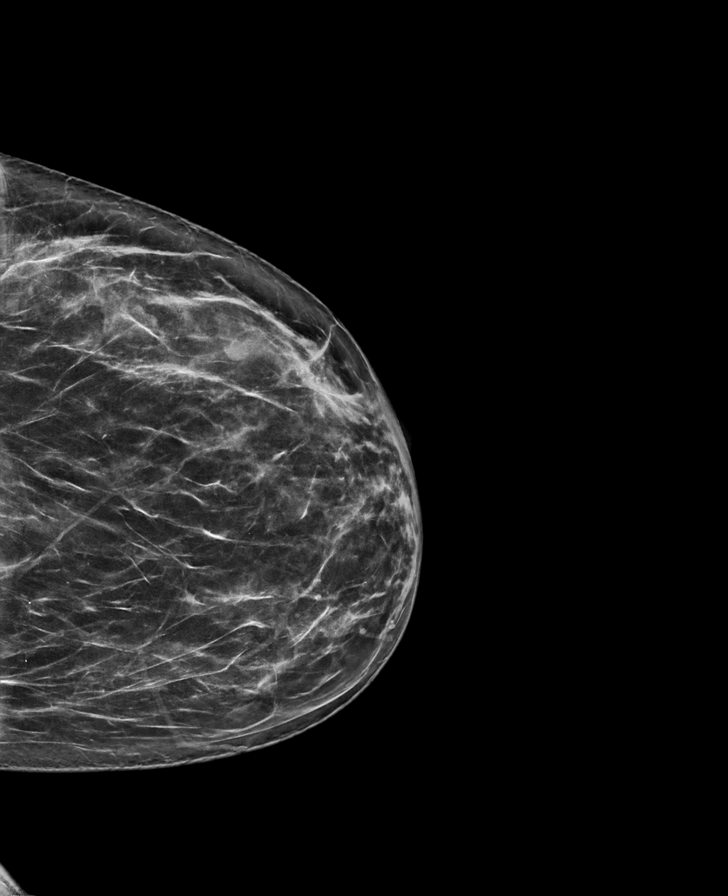

[L MLO synth-2D]
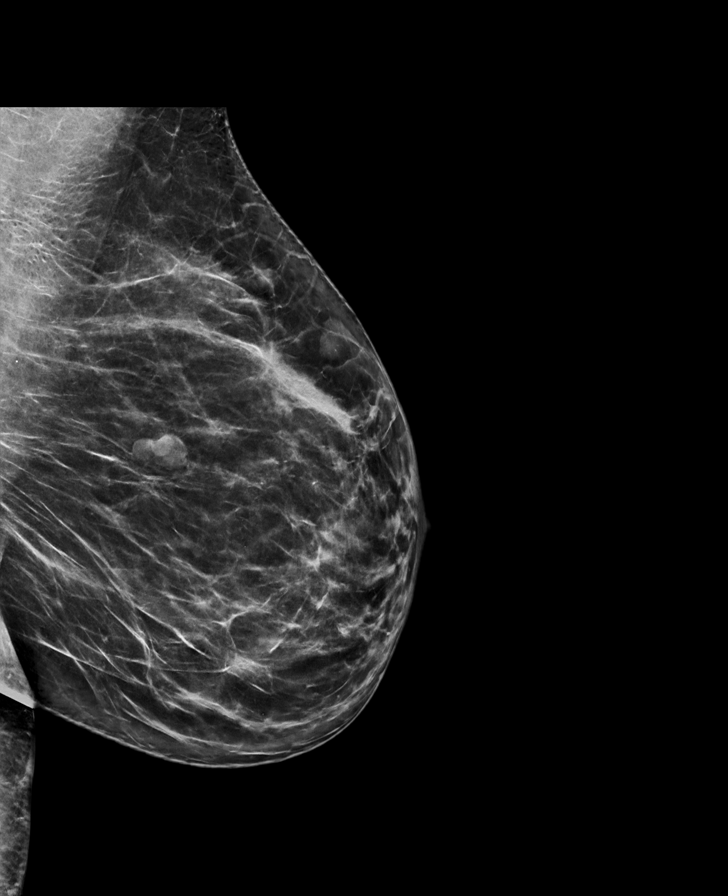

[R CC synth-2D]
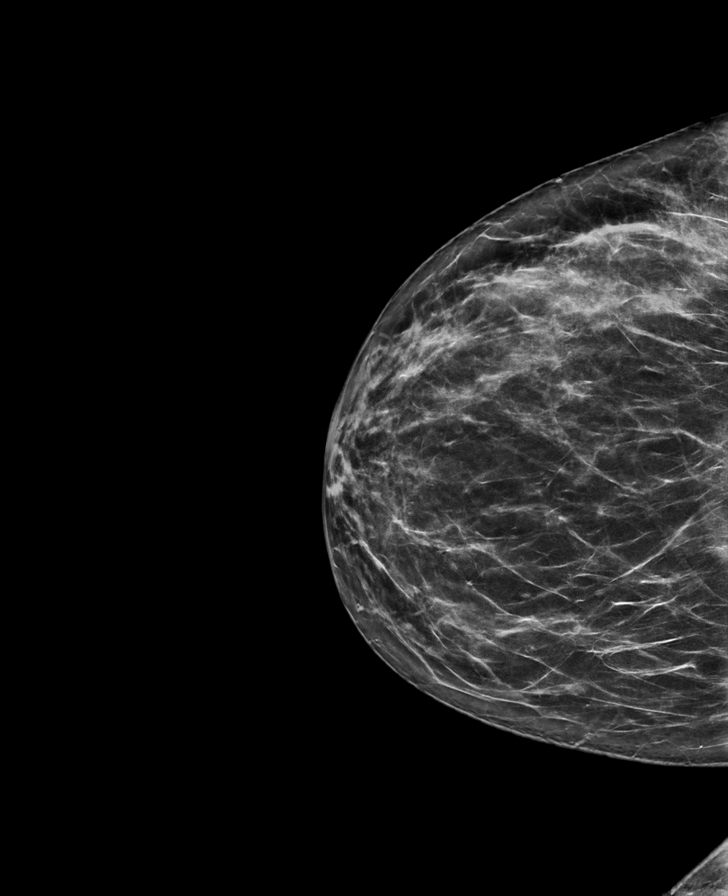

[R CC tomo · tomo slice 39/77.0]
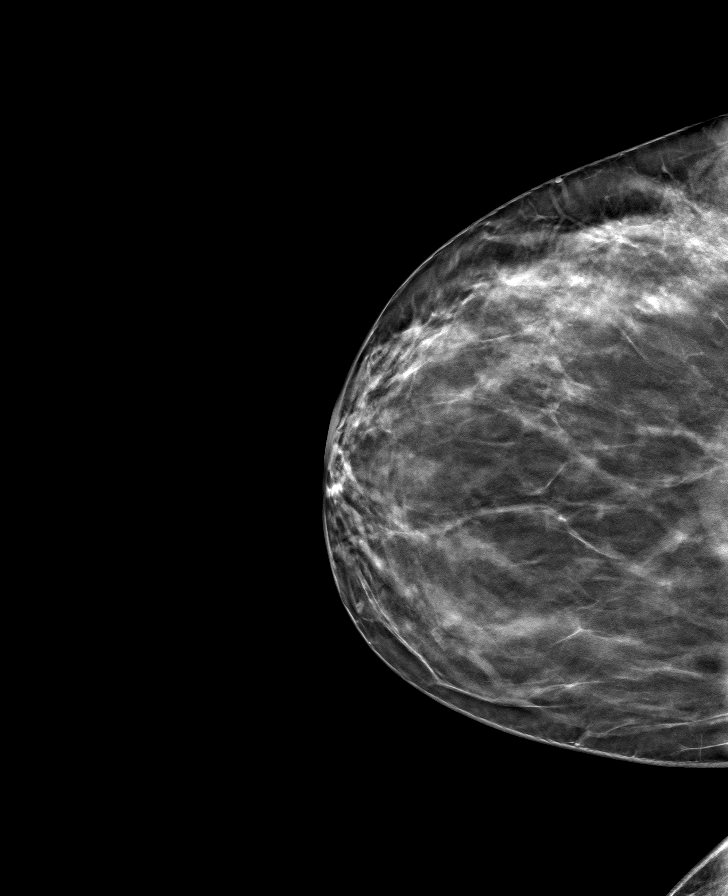

[L MLO tomo · tomo slice 41/80.0]
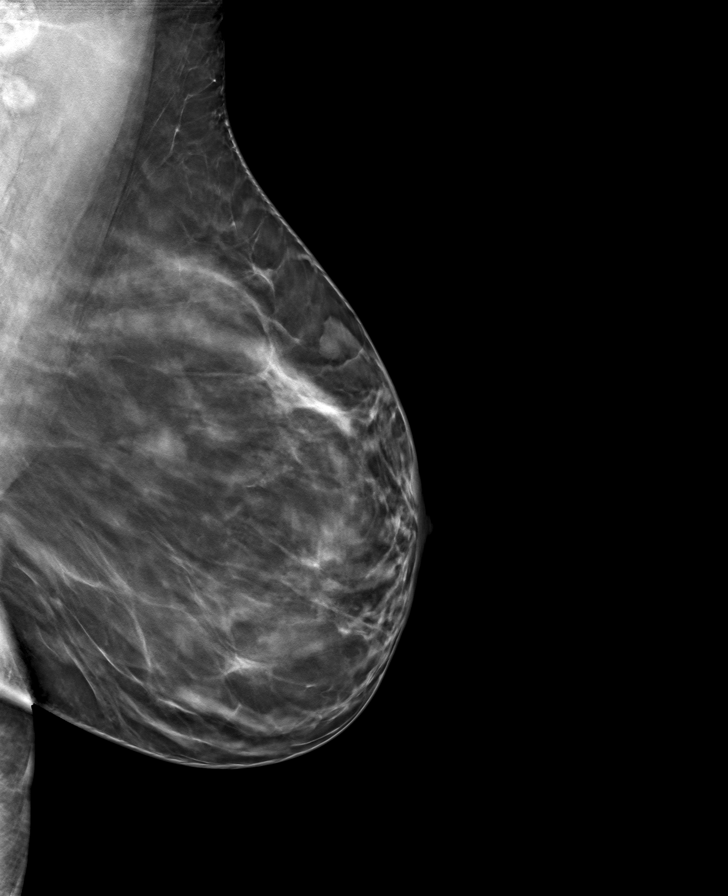

[L CC tomo · tomo slice 38/75.0]
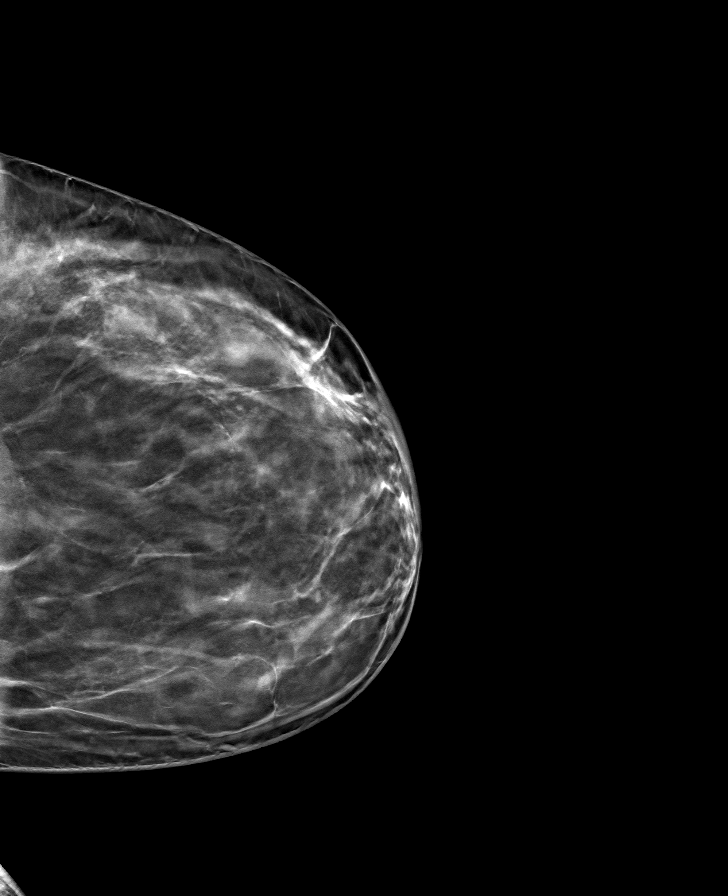

[R MLO tomo · tomo slice 43/86.0]
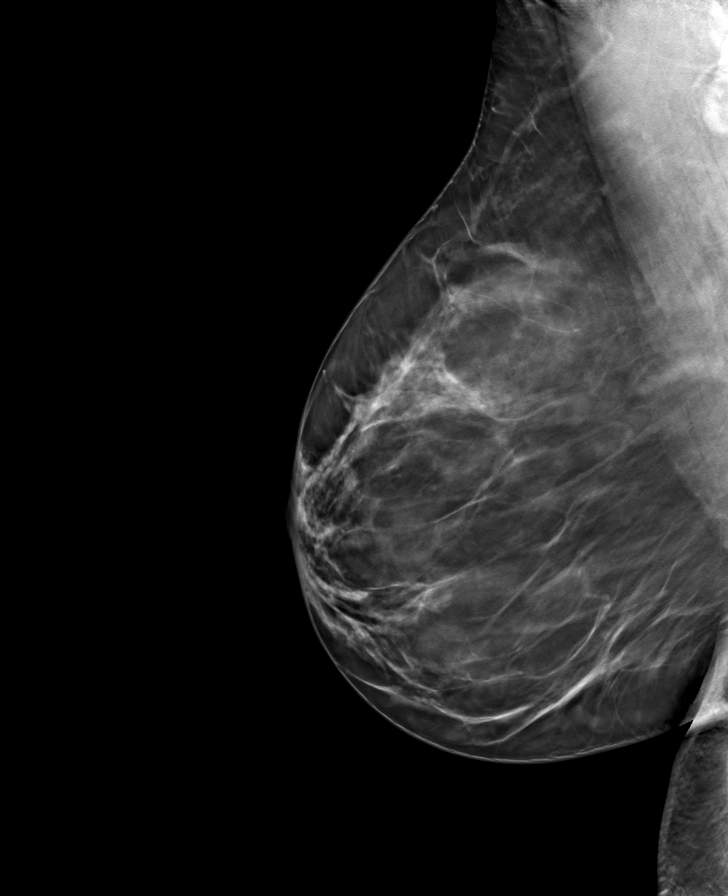

[8 of 24 positions shown; findings below may reference images not displayed]

ACR Breast Density Category c: The breast tissue is heterogeneously
dense, which may obscure small masses.
FINDINGS: There are no findings suspicious for malignancy.
IMPRESSION: No mammographic evidence of malignancy. A result letter of this
screening mammogram will be mailed directly to the patient.

RECOMMENDATION:
Screening mammogram in one year. (Code:Q3-W-BC3)

BI-RADS CATEGORY  1: Negative.

## 2023-03-05 ENCOUNTER — Other Ambulatory Visit: Payer: Self-pay | Admitting: Family Medicine

## 2023-03-05 DIAGNOSIS — Z1231 Encounter for screening mammogram for malignant neoplasm of breast: Secondary | ICD-10-CM

## 2023-03-17 ENCOUNTER — Other Ambulatory Visit (HOSPITAL_BASED_OUTPATIENT_CLINIC_OR_DEPARTMENT_OTHER): Payer: Self-pay

## 2023-03-17 MED ORDER — AZITHROMYCIN 250 MG PO TABS
ORAL_TABLET | ORAL | 0 refills | Status: AC
  Filled 2023-03-17: qty 6, 5d supply, fill #0

## 2023-03-17 MED ORDER — BENZONATATE 100 MG PO CAPS
100.0000 mg | ORAL_CAPSULE | Freq: Three times a day (TID) | ORAL | 0 refills | Status: DC
  Filled 2023-03-17: qty 21, 7d supply, fill #0

## 2023-04-29 ENCOUNTER — Ambulatory Visit
Admission: RE | Admit: 2023-04-29 | Discharge: 2023-04-29 | Disposition: A | Discharge status: Home / Self Care | Payer: BC Managed Care – PPO | Source: Ambulatory Visit | Attending: Family Medicine | Admitting: Family Medicine

## 2023-04-29 DIAGNOSIS — Z1231 Encounter for screening mammogram for malignant neoplasm of breast: Secondary | ICD-10-CM

## 2023-05-24 ENCOUNTER — Encounter: Payer: BC Managed Care – PPO | Admitting: Family Medicine

## 2023-06-02 NOTE — Progress Notes (Unsigned)
 No chief complaint on file.  [name] is a 48 y.o. female who presents for a complete physical.  She had GYN exam 03/2021. IUD was removed and another was inserted at that visit. She only plans to see GYN when IUD needs to be removed, not yearly.  Last year she was complaining of indigestion, bloating. She reported the smell of fried foods made her nauseated.  She would have bloating after eating just a small amount of fried fish, also has occurred with chick fil-A (along with also having a frozen lemonade.)  Once also noted RUQ pain with bloating, relieved by OTC gas medication. She gets heartburn infrequently, if eating late, red sauces. Eats some chocolate, but doesn't seem to trigger the symptoms.   Family history of breast cancer.  She had negative genetic testing. Breast cancer was diagnosed in her mother in 2023, and her mother's 2 sisters also had breast cancer (>age 46).   Patient has h/o breast adenoma, but normal mammograms since.  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 She gets about 2-3 outbreaks in the winter. Last filled #30 in 04/2022.  Asthma--triggered by strong perfumes, bleach, and high humidity.  It was worse when she was a child. She has had very few problems since being in [place].  She mainly uses inhaler when using bleach at work, or related to perfumes.   Immunization History  Administered Date(s) Administered   Hepatitis A 05/28/2000   Hepatitis A, Ped/Adol-2 Dose 05/28/2020   Hepatitis B 06/19/1999, 05/28/2000, 01/10/2004, 09/08/2005   Influenza, Seasonal, Injecte, Preservative Fre 12/17/2022   Influenza,inj,Quad PF,6+ Mos 12/19/2021   Influenza-Unspecified 12/10/2020   MMR 06/19/1999   Meningococcal Conjugate 02/01/2014   PFIZER Comirnaty(Gray Top)Covid-19 Tri-Sucrose Vaccine 03/10/2019, 03/30/2019   PFIZER(Purple Top)SARS-COV-2 Vaccination 03/10/2019, 03/30/2019, 02/20/2020   Td 06/19/1999, 06/06/2002, 09/08/2005, 10/07/2009   Tdap  10/07/2009   Varicella 04/24/2005, 12/22/2017, 01/31/2018   Declines COVID boosters. Last Pap smear: 03/2021 (NILM, neg HPV) Last mammogram: 04/2023 Last colonoscopy: 08/2022 Dr. Budd Palmer adenoma. 7 year f/u recommended Last DEXA: never Dentist: over a year ago. *** Ophtho: yearly, wears glasses Exercise: None  Lipids:  Diet at time of last lipids:  has some lamb, other red meat. Some fried foods.  Lab Results  Component Value Date   CHOL 181 05/04/2022   HDL 36 (L) 05/04/2022   LDLCALC 127 (H) 05/04/2022   TRIG 95 05/04/2022   CHOLHDL 5.0 (H) 05/04/2022   She had normal cbc, c-met in 04/2022, normal TSH in 11/2020. Vitamin D-OH screening was normal at 40 in 10/2010.    PMH, PSH, SH and FH were reviewed and updated     ROS:  The patient denies anorexia, fever, weight changes, headaches,  vision changes, decreased hearing, ear pain, sore throat, breast concerns, chest pain, palpitations, dizziness, syncope, dyspnea on exertion, cough, swelling, nausea, vomiting, diarrhea, constipation, abdominal pain, melena, hematochezia, indigestion/heartburn, hematuria, incontinence, dysuria, vaginal discharge, odor or itch, genital lesions, joint pains, numbness, tingling, weakness, tremor, suspicious skin lesions, depression, anxiety, abnormal bleeding/bruising, or enlarged lymph nodes.  Some R sided sciatica, for years, somewhat better than it has been. Bloating after eating, indigestion and heartburn per HPI.  She has no vaginal bleeding/menses, related to having Mirena IUD.   She denies hot flashes or night sweats.   PHYSICAL EXAM:  There were no vitals taken for this visit.  Wt Readings from Last 3 Encounters:  09/22/22 180 lb (81.6 kg)  07/23/22 180 lb (81.6 kg)  05/04/22 182 lb (82.6 kg)   General Appearance:    Alert, cooperative, no distress, appears stated age  Head:    Normocephalic, without obvious abnormality, atraumatic  Eyes:    PERRL, conjunctiva/corneas  clear, EOM's intact  Ears:    Normal TM's and external ear canals  Nose:   No nasal drainage or sinus tenderness  Throat:   Normal mucosa   Neck:   Supple, no lymphadenopathy;  thyroid:  no enlargement/ tenderness/nodules  Back:    Spine nontender, no curvature, ROM normal, no CVA  tenderness  Lungs:     Clear to auscultation bilaterally without wheezes, rales or  ronchi; respirations unlabored  Chest Wall:    No tenderness or deformity   Heart:    Regular rate and rhythm, S1 and S2 normal, no murmur, rub or gallop  Breast Exam:    No nipple inversion or discharge. No skin dimpling/puckering. She has a tender area of the R breast at 9 o'clock, with fibroglandular texture in this area. *** No axillary lymphadenopathy.  Abdomen:     Soft, non-tender, nondistended, normoactive bowel sounds, no masses, no hepatosplenomegaly  Genitalia:    Rectal:    Normal external genitalia without lesions, rash. Normal BUS/vagina.  Strings palpable at cervical os. No cervical motion tenderness. No adnexal or uterine masses or tenderness. Normal sphincter tone, no mass.  Heme negative stool  Extremities:   No clubbing, cyanosis or edema  Pulses:   2+ and symmetric all extremities  Skin:   Skin color, texture, turgor normal, no rashes or lesions  Lymph nodes:   Cervical, supraclavicular, inguinal and axillary nodes normal  Neurologic:   CNII-XII intact, normal strength, sensation and gait                    Psych:   Normal mood, affect, hygiene and grooming.                  ***UPDATE R BREAST   ASSESSMENT/PLAN:    Need albuterol RF?  Did she get Tdap through [facility] (since advised that her last one was in 2011--she was advised to schedule [place] for Tdap or get [facility]) If didn't get, please give today.  Discussed monthly self breast exams and yearly mammograms; at least 30 minutes of aerobic activity at least 5 days/week, weight-bearing exercise at least 2x/week; proper sunscreen use  reviewed; healthy diet, including goals of calcium and vitamin D intake and alcohol recommendations (less than or equal to 1 drink/day) reviewed; regular seatbelt use; changing batteries in smoke detectors.  Immunization recommendations discussed. Continue yearly flu shots.  TdaP *** Prevnar-20 next year. Shingles next year. Colonoscopy recommendations reviewed, UTD.  Due again 08/2029.   F/u 1 year, sooner prn.

## 2023-06-02 NOTE — Patient Instructions (Incomplete)
  HEALTH MAINTENANCE RECOMMENDATIONS:  It is recommended that you get at least 30 minutes of aerobic exercise at least 5 days/week (for weight loss, you may need as much as 60-90 minutes). This can be any activity that gets your heart rate up. This can be divided in 10-15 minute intervals if needed, but try and build up your endurance at least once a week.  Weight bearing exercise is also recommended twice weekly.  Eat a healthy diet with lots of vegetables, fruits and fiber.  "Colorful" foods have a lot of vitamins (ie green vegetables, tomatoes, red peppers, etc).  Limit sweet tea, regular sodas and alcoholic beverages, all of which has a lot of calories and sugar.  Up to 1 alcoholic drink daily may be beneficial for women (unless trying to lose weight, watch sugars).  Drink a lot of water.  Calcium recommendations are 1200-1500 mg daily (1500 mg for postmenopausal women or women without ovaries), and vitamin D 1000 IU daily.  This should be obtained from diet and/or supplements (vitamins), and calcium should not be taken all at once, but in divided doses.  Monthly self breast exams and yearly mammograms for women over the age of 74 is recommended.  Sunscreen of at least SPF 30 should be used on all sun-exposed parts of the skin when outside between the hours of 10 am and 4 pm (not just when at beach or pool, but even with exercise, golf, tennis, and yard work!)  Use a sunscreen that says "broad spectrum" so it covers both UVA and UVB rays, and make sure to reapply every 1-2 hours.  Remember to change the batteries in your smoke detectors when changing your clock times in the spring and fall. Carbon monoxide detectors are recommended for your home.  Use your seat belt every time you are in a car, and please drive safely and not be distracted with cell phones and texting while driving.   Next year (age 4), it will be recommended that you get shingles vaccine and pneumonia vaccine (prevnar-20).

## 2023-06-03 ENCOUNTER — Encounter: Payer: Self-pay | Admitting: *Deleted

## 2023-06-03 ENCOUNTER — Encounter: Payer: Self-pay | Admitting: Family Medicine

## 2023-06-03 ENCOUNTER — Ambulatory Visit: Admitting: Family Medicine

## 2023-06-03 VITALS — BP 116/72 | HR 80 | Ht 67.0 in | Wt 182.2 lb

## 2023-06-03 DIAGNOSIS — E785 Hyperlipidemia, unspecified: Secondary | ICD-10-CM

## 2023-06-03 DIAGNOSIS — H8112 Benign paroxysmal vertigo, left ear: Secondary | ICD-10-CM

## 2023-06-03 DIAGNOSIS — Z23 Encounter for immunization: Secondary | ICD-10-CM

## 2023-06-03 DIAGNOSIS — Z0000 Encounter for general adult medical examination without abnormal findings: Secondary | ICD-10-CM

## 2023-06-03 DIAGNOSIS — B001 Herpesviral vesicular dermatitis: Secondary | ICD-10-CM

## 2023-06-03 DIAGNOSIS — J4520 Mild intermittent asthma, uncomplicated: Secondary | ICD-10-CM

## 2023-06-03 DIAGNOSIS — R42 Dizziness and giddiness: Secondary | ICD-10-CM | POA: Diagnosis not present

## 2023-06-03 DIAGNOSIS — Z6828 Body mass index (BMI) 28.0-28.9, adult: Secondary | ICD-10-CM

## 2023-06-03 DIAGNOSIS — Z803 Family history of malignant neoplasm of breast: Secondary | ICD-10-CM | POA: Diagnosis not present

## 2023-06-03 LAB — LIPID PANEL

## 2023-06-04 ENCOUNTER — Encounter: Payer: Self-pay | Admitting: Family Medicine

## 2023-06-04 LAB — CMP14+EGFR
ALT: 20 IU/L (ref 0–32)
AST: 18 IU/L (ref 0–40)
Albumin: 4.6 g/dL (ref 3.9–4.9)
Alkaline Phosphatase: 44 IU/L (ref 44–121)
BUN/Creatinine Ratio: 16 (ref 9–23)
BUN: 12 mg/dL (ref 6–24)
Bilirubin Total: 0.4 mg/dL (ref 0.0–1.2)
CO2: 24 mmol/L (ref 20–29)
Calcium: 9.6 mg/dL (ref 8.7–10.2)
Chloride: 101 mmol/L (ref 96–106)
Creatinine, Ser: 0.76 mg/dL (ref 0.57–1.00)
Globulin, Total: 3.1 g/dL (ref 1.5–4.5)
Glucose: 90 mg/dL (ref 70–99)
Potassium: 4.4 mmol/L (ref 3.5–5.2)
Sodium: 137 mmol/L (ref 134–144)
Total Protein: 7.7 g/dL (ref 6.0–8.5)
eGFR: 96 mL/min/{1.73_m2} (ref >59)

## 2023-06-04 LAB — LIPID PANEL
Cholesterol, Total: 205 mg/dL — ABNORMAL HIGH (ref 100–199)
HDL: 39 mg/dL — ABNORMAL LOW (ref >39)
LDL CALC COMMENT:: 5.3 ratio — ABNORMAL HIGH (ref 0.0–4.4)
LDL Chol Calc (NIH): 141 mg/dL — ABNORMAL HIGH (ref 0–99)
Triglycerides: 136 mg/dL (ref 0–149)
VLDL Cholesterol Cal: 25 mg/dL (ref 5–40)

## 2023-06-04 LAB — CBC WITH DIFFERENTIAL/PLATELET
Basophils Absolute: 0 10*3/uL (ref 0.0–0.2)
Basos: 1 %
EOS (ABSOLUTE): 0.1 10*3/uL (ref 0.0–0.4)
Eos: 1 %
Hematocrit: 43.1 % (ref 34.0–46.6)
Hemoglobin: 14.6 g/dL (ref 11.1–15.9)
Immature Grans (Abs): 0 10*3/uL (ref 0.0–0.1)
Immature Granulocytes: 0 %
Lymphocytes Absolute: 2.8 10*3/uL (ref 0.7–3.1)
Lymphs: 34 %
MCH: 30 pg (ref 26.6–33.0)
MCHC: 33.9 g/dL (ref 31.5–35.7)
MCV: 89 fL (ref 79–97)
Monocytes Absolute: 0.5 10*3/uL (ref 0.1–0.9)
Monocytes: 6 %
Neutrophils Absolute: 4.8 10*3/uL (ref 1.4–7.0)
Neutrophils: 58 %
Platelets: 273 10*3/uL (ref 150–450)
RBC: 4.86 x10E6/uL (ref 3.77–5.28)
RDW: 12.4 % (ref 11.7–15.4)
WBC: 8.2 10*3/uL (ref 3.4–10.8)

## 2023-07-22 ENCOUNTER — Other Ambulatory Visit: Payer: Self-pay | Admitting: Family Medicine

## 2023-07-22 DIAGNOSIS — B001 Herpesviral vesicular dermatitis: Secondary | ICD-10-CM

## 2023-07-22 MED ORDER — VALACYCLOVIR HCL 1 G PO TABS: ORAL_TABLET | ORAL | 0 refills | Status: AC

## 2023-07-22 NOTE — Telephone Encounter (Signed)
 Is this okay to refill?

## 2023-12-22 NOTE — Progress Notes (Unsigned)
 No chief complaint on file.    Hyperlipidemia: LDL was higher on last physical.  We discussed low cholesterol diet, encouraging more chicken and plant-based proteins, less lamb and other red meats. She had set a goal to take daily walks with her husband  Component Ref Range & Units (hover) 6 mo ago 1 yr ago 3 yr ago  Cholesterol, Total 205 High  181 175  Triglycerides 136 95 179 High   HDL 39 Low  36 Low  33 Low   VLDL Cholesterol Cal 25 18 32  LDL Chol Calc (NIH) 141 High  127 High  110 High   Chol/HDL Ratio 5.3 High  5.0 High  CM 5.3 High  CM     PMH, PSH, SH reviewed   ROS:    PHYSICAL EXAM:  There were no vitals taken for this visit.  Wt Readings from Last 3 Encounters:  06/03/23 182 lb 3.2 oz (82.6 kg)  09/22/22 180 lb (81.6 kg)  07/23/22 180 lb (81.6 kg)       ASSESSMENT/PLAN:   lipids

## 2023-12-23 ENCOUNTER — Encounter: Payer: Self-pay | Admitting: Family Medicine

## 2023-12-23 ENCOUNTER — Ambulatory Visit: Admitting: Family Medicine

## 2023-12-23 VITALS — BP 110/60 | HR 84 | Ht 67.0 in | Wt 182.8 lb

## 2023-12-23 DIAGNOSIS — E785 Hyperlipidemia, unspecified: Secondary | ICD-10-CM | POA: Diagnosis not present

## 2023-12-23 DIAGNOSIS — R7301 Impaired fasting glucose: Secondary | ICD-10-CM

## 2023-12-23 DIAGNOSIS — Z7185 Encounter for immunization safety counseling: Secondary | ICD-10-CM | POA: Diagnosis not present

## 2023-12-23 LAB — POCT GLYCOSYLATED HEMOGLOBIN (HGB A1C): Hemoglobin A1C: 5.5 % (ref 4.0–5.6)

## 2023-12-23 LAB — LIPID PANEL
Chol/HDL Ratio: 5.1 ratio — ABNORMAL HIGH (ref 0.0–4.4)
Cholesterol, Total: 189 mg/dL (ref 100–199)
HDL: 37 mg/dL — ABNORMAL LOW (ref >39)
LDL Chol Calc (NIH): 133 mg/dL — ABNORMAL HIGH (ref 0–99)
Triglycerides: 105 mg/dL (ref 0–149)
VLDL Cholesterol Cal: 19 mg/dL (ref 5–40)

## 2023-12-23 LAB — POCT CBG (FASTING - GLUCOSE)-MANUAL ENTRY: Glucose Fasting, POC: 108 mg/dL — AB (ref 70–99)

## 2023-12-23 NOTE — Patient Instructions (Signed)
 We discussed getting Shingrix at some point (series of 2 shots given 2 months apart). In case there are side effects, you should do it on a day that you have nothing planned the following day, in case you are achey. You can get this vaccine from the pharmacy, or schedule a nurse visit at our office at your convenience.

## 2023-12-24 ENCOUNTER — Ambulatory Visit: Payer: Self-pay | Admitting: Family Medicine

## 2023-12-24 ENCOUNTER — Encounter: Payer: Self-pay | Admitting: Family Medicine

## 2023-12-24 ENCOUNTER — Other Ambulatory Visit (HOSPITAL_BASED_OUTPATIENT_CLINIC_OR_DEPARTMENT_OTHER): Payer: Self-pay

## 2023-12-24 MED ORDER — ACCU-CHEK GUIDE W/DEVICE KIT
PACK | 0 refills | Status: AC
  Filled 2023-12-24: qty 1, 30d supply, fill #0
  Filled 2023-12-24: qty 1, fill #0

## 2023-12-24 MED ORDER — LANCETS MISC
1.0000 | 0 refills | Status: AC
  Filled 2023-12-24: qty 100, fill #0

## 2023-12-24 MED ORDER — LANCET DEVICE MISC
0 refills | Status: AC
  Filled 2023-12-24: qty 1, fill #0

## 2023-12-24 MED ORDER — BLOOD GLUCOSE TEST VI STRP
ORAL_STRIP | 0 refills | Status: AC
  Filled 2023-12-24 (×2): qty 100, 50d supply, fill #0

## 2024-06-26 ENCOUNTER — Encounter: Payer: Self-pay | Admitting: Family Medicine
